# Patient Record
Sex: Male | Born: 1935 | Marital: Married | Smoking: Former smoker
Health system: Southern US, Community
[De-identification: ages and names within clinical notes are randomized; demographics above are authoritative.]

## PROBLEM LIST (undated history)

## (undated) DIAGNOSIS — I739 Peripheral vascular disease, unspecified: Secondary | ICD-10-CM

## (undated) DIAGNOSIS — I2510 Atherosclerotic heart disease of native coronary artery without angina pectoris: Secondary | ICD-10-CM

## (undated) DIAGNOSIS — M1990 Unspecified osteoarthritis, unspecified site: Secondary | ICD-10-CM

## (undated) DIAGNOSIS — G4730 Sleep apnea, unspecified: Secondary | ICD-10-CM

## (undated) DIAGNOSIS — M1A00X0 Idiopathic chronic gout, unspecified site, without tophus (tophi): Secondary | ICD-10-CM

## (undated) DIAGNOSIS — E785 Hyperlipidemia, unspecified: Secondary | ICD-10-CM

## (undated) DIAGNOSIS — I499 Cardiac arrhythmia, unspecified: Secondary | ICD-10-CM

## (undated) DIAGNOSIS — K219 Gastro-esophageal reflux disease without esophagitis: Secondary | ICD-10-CM

## (undated) DIAGNOSIS — I10 Essential (primary) hypertension: Secondary | ICD-10-CM

## (undated) DIAGNOSIS — N189 Chronic kidney disease, unspecified: Secondary | ICD-10-CM

## (undated) DIAGNOSIS — E119 Type 2 diabetes mellitus without complications: Secondary | ICD-10-CM

## (undated) DIAGNOSIS — J449 Chronic obstructive pulmonary disease, unspecified: Secondary | ICD-10-CM

## (undated) HISTORY — PX: CORONARY ARTERY BYPASS GRAFT: SHX141

## (undated) HISTORY — PX: PACEMAKER PLACEMENT: SHX43

## (undated) HISTORY — DX: Essential (primary) hypertension: I10

## (undated) HISTORY — DX: Chronic obstructive pulmonary disease, unspecified: J44.9

## (undated) HISTORY — DX: Unspecified osteoarthritis, unspecified site: M19.90

## (undated) HISTORY — PX: FOOT SURGERY: SHX648

## (undated) HISTORY — PX: OTHER SURGICAL HISTORY: SHX169

## (undated) HISTORY — PX: CHOLECYSTECTOMY: SHX55

## (undated) HISTORY — DX: Hyperlipidemia, unspecified: E78.5

## (undated) HISTORY — PX: APPENDECTOMY: SHX54

## (undated) HISTORY — PX: LEG SURGERY: SHX1003

## (undated) HISTORY — DX: Atherosclerotic heart disease of native coronary artery without angina pectoris: I25.10

## (undated) HISTORY — DX: Gastro-esophageal reflux disease without esophagitis: K21.9

## (undated) HISTORY — DX: Type 2 diabetes mellitus without complications: E11.9

## (undated) HISTORY — PX: SHOULDER SURGERY: SHX246

---

## 2009-10-02 DIAGNOSIS — E785 Hyperlipidemia, unspecified: Secondary | ICD-10-CM | POA: Insufficient documentation

## 2010-07-25 DIAGNOSIS — E119 Type 2 diabetes mellitus without complications: Secondary | ICD-10-CM | POA: Insufficient documentation

## 2011-02-25 DIAGNOSIS — Z951 Presence of aortocoronary bypass graft: Secondary | ICD-10-CM | POA: Insufficient documentation

## 2011-02-27 DIAGNOSIS — J40 Bronchitis, not specified as acute or chronic: Secondary | ICD-10-CM | POA: Insufficient documentation

## 2011-03-25 DIAGNOSIS — R05 Cough: Secondary | ICD-10-CM | POA: Insufficient documentation

## 2011-06-18 DIAGNOSIS — L439 Lichen planus, unspecified: Secondary | ICD-10-CM | POA: Insufficient documentation

## 2011-06-18 DIAGNOSIS — B079 Viral wart, unspecified: Secondary | ICD-10-CM | POA: Insufficient documentation

## 2011-08-19 DIAGNOSIS — R252 Cramp and spasm: Secondary | ICD-10-CM | POA: Insufficient documentation

## 2011-08-19 DIAGNOSIS — R600 Localized edema: Secondary | ICD-10-CM | POA: Insufficient documentation

## 2012-01-27 DIAGNOSIS — I200 Unstable angina: Secondary | ICD-10-CM | POA: Insufficient documentation

## 2012-01-30 DIAGNOSIS — Z9889 Other specified postprocedural states: Secondary | ICD-10-CM | POA: Insufficient documentation

## 2012-01-30 DIAGNOSIS — Z950 Presence of cardiac pacemaker: Secondary | ICD-10-CM | POA: Insufficient documentation

## 2012-02-19 DIAGNOSIS — I4892 Unspecified atrial flutter: Secondary | ICD-10-CM | POA: Insufficient documentation

## 2012-03-16 DIAGNOSIS — R002 Palpitations: Secondary | ICD-10-CM | POA: Insufficient documentation

## 2012-06-22 DIAGNOSIS — R609 Edema, unspecified: Secondary | ICD-10-CM | POA: Insufficient documentation

## 2013-03-07 DIAGNOSIS — N138 Other obstructive and reflux uropathy: Secondary | ICD-10-CM | POA: Insufficient documentation

## 2013-04-26 DIAGNOSIS — I480 Paroxysmal atrial fibrillation: Secondary | ICD-10-CM | POA: Insufficient documentation

## 2013-06-03 DIAGNOSIS — R42 Dizziness and giddiness: Secondary | ICD-10-CM | POA: Insufficient documentation

## 2013-06-09 DIAGNOSIS — L03211 Cellulitis of face: Secondary | ICD-10-CM | POA: Insufficient documentation

## 2013-06-09 DIAGNOSIS — H2513 Age-related nuclear cataract, bilateral: Secondary | ICD-10-CM | POA: Insufficient documentation

## 2013-06-09 DIAGNOSIS — H01006 Unspecified blepharitis left eye, unspecified eyelid: Secondary | ICD-10-CM

## 2013-06-09 DIAGNOSIS — H02839 Dermatochalasis of unspecified eye, unspecified eyelid: Secondary | ICD-10-CM | POA: Insufficient documentation

## 2013-06-09 DIAGNOSIS — H01003 Unspecified blepharitis right eye, unspecified eyelid: Secondary | ICD-10-CM | POA: Insufficient documentation

## 2013-06-17 DIAGNOSIS — H9190 Unspecified hearing loss, unspecified ear: Secondary | ICD-10-CM | POA: Insufficient documentation

## 2013-06-17 DIAGNOSIS — H6120 Impacted cerumen, unspecified ear: Secondary | ICD-10-CM | POA: Insufficient documentation

## 2013-09-22 DIAGNOSIS — N183 Chronic kidney disease, stage 3 unspecified: Secondary | ICD-10-CM | POA: Insufficient documentation

## 2014-04-10 DIAGNOSIS — M47816 Spondylosis without myelopathy or radiculopathy, lumbar region: Secondary | ICD-10-CM | POA: Insufficient documentation

## 2014-05-22 DIAGNOSIS — I872 Venous insufficiency (chronic) (peripheral): Secondary | ICD-10-CM | POA: Insufficient documentation

## 2015-01-26 DIAGNOSIS — Z961 Presence of intraocular lens: Secondary | ICD-10-CM | POA: Insufficient documentation

## 2015-02-02 DIAGNOSIS — Z961 Presence of intraocular lens: Secondary | ICD-10-CM | POA: Insufficient documentation

## 2015-04-02 DIAGNOSIS — L409 Psoriasis, unspecified: Secondary | ICD-10-CM | POA: Insufficient documentation

## 2016-02-25 DIAGNOSIS — E1142 Type 2 diabetes mellitus with diabetic polyneuropathy: Secondary | ICD-10-CM | POA: Insufficient documentation

## 2017-07-28 DIAGNOSIS — H353112 Nonexudative age-related macular degeneration, right eye, intermediate dry stage: Secondary | ICD-10-CM | POA: Insufficient documentation

## 2017-12-21 DIAGNOSIS — R5383 Other fatigue: Secondary | ICD-10-CM

## 2017-12-21 DIAGNOSIS — R5381 Other malaise: Secondary | ICD-10-CM | POA: Insufficient documentation

## 2018-01-22 DIAGNOSIS — M1A00X0 Idiopathic chronic gout, unspecified site, without tophus (tophi): Secondary | ICD-10-CM | POA: Insufficient documentation

## 2018-01-22 DIAGNOSIS — E1151 Type 2 diabetes mellitus with diabetic peripheral angiopathy without gangrene: Secondary | ICD-10-CM | POA: Insufficient documentation

## 2018-01-22 DIAGNOSIS — I25810 Atherosclerosis of coronary artery bypass graft(s) without angina pectoris: Secondary | ICD-10-CM | POA: Insufficient documentation

## 2018-01-22 DIAGNOSIS — I739 Peripheral vascular disease, unspecified: Secondary | ICD-10-CM | POA: Insufficient documentation

## 2018-02-19 ENCOUNTER — Other Ambulatory Visit (HOSPITAL_COMMUNITY): Payer: Self-pay | Admitting: Sports Medicine

## 2018-02-19 DIAGNOSIS — M4807 Spinal stenosis, lumbosacral region: Secondary | ICD-10-CM

## 2018-02-19 DIAGNOSIS — M5442 Lumbago with sciatica, left side: Secondary | ICD-10-CM

## 2018-02-19 DIAGNOSIS — M5137 Other intervertebral disc degeneration, lumbosacral region: Secondary | ICD-10-CM

## 2018-02-19 DIAGNOSIS — M25551 Pain in right hip: Secondary | ICD-10-CM

## 2018-02-19 DIAGNOSIS — M47816 Spondylosis without myelopathy or radiculopathy, lumbar region: Secondary | ICD-10-CM

## 2018-02-19 DIAGNOSIS — M25552 Pain in left hip: Secondary | ICD-10-CM

## 2018-02-19 DIAGNOSIS — M5441 Lumbago with sciatica, right side: Secondary | ICD-10-CM

## 2018-02-19 DIAGNOSIS — G8929 Other chronic pain: Secondary | ICD-10-CM

## 2018-03-03 ENCOUNTER — Ambulatory Visit (HOSPITAL_COMMUNITY)
Admission: RE | Admit: 2018-03-03 | Discharge: 2018-03-03 | Disposition: A | Discharge status: Home / Self Care | Payer: Medicare Other | Source: Ambulatory Visit | Attending: Sports Medicine | Admitting: Sports Medicine

## 2018-03-03 DIAGNOSIS — M25552 Pain in left hip: Secondary | ICD-10-CM | POA: Diagnosis not present

## 2018-03-03 DIAGNOSIS — M47817 Spondylosis without myelopathy or radiculopathy, lumbosacral region: Secondary | ICD-10-CM | POA: Insufficient documentation

## 2018-03-03 DIAGNOSIS — M5137 Other intervertebral disc degeneration, lumbosacral region: Secondary | ICD-10-CM

## 2018-03-03 DIAGNOSIS — M4316 Spondylolisthesis, lumbar region: Secondary | ICD-10-CM | POA: Insufficient documentation

## 2018-03-03 DIAGNOSIS — M5442 Lumbago with sciatica, left side: Secondary | ICD-10-CM | POA: Diagnosis not present

## 2018-03-03 DIAGNOSIS — M47816 Spondylosis without myelopathy or radiculopathy, lumbar region: Secondary | ICD-10-CM | POA: Diagnosis not present

## 2018-03-03 DIAGNOSIS — G8929 Other chronic pain: Secondary | ICD-10-CM

## 2018-03-03 DIAGNOSIS — M5441 Lumbago with sciatica, right side: Secondary | ICD-10-CM | POA: Insufficient documentation

## 2018-03-03 DIAGNOSIS — M48061 Spinal stenosis, lumbar region without neurogenic claudication: Secondary | ICD-10-CM | POA: Diagnosis not present

## 2018-03-03 DIAGNOSIS — M5136 Other intervertebral disc degeneration, lumbar region: Secondary | ICD-10-CM | POA: Insufficient documentation

## 2018-03-03 DIAGNOSIS — M25551 Pain in right hip: Secondary | ICD-10-CM | POA: Diagnosis not present

## 2018-03-03 DIAGNOSIS — M4807 Spinal stenosis, lumbosacral region: Secondary | ICD-10-CM

## 2018-03-04 ENCOUNTER — Ambulatory Visit (INDEPENDENT_AMBULATORY_CARE_PROVIDER_SITE_OTHER): Payer: Medicare Other | Admitting: Vascular Surgery

## 2018-03-04 ENCOUNTER — Encounter (INDEPENDENT_AMBULATORY_CARE_PROVIDER_SITE_OTHER): Payer: Self-pay | Admitting: Vascular Surgery

## 2018-03-04 DIAGNOSIS — Z87891 Personal history of nicotine dependence: Secondary | ICD-10-CM

## 2018-03-04 DIAGNOSIS — I70213 Atherosclerosis of native arteries of extremities with intermittent claudication, bilateral legs: Secondary | ICD-10-CM | POA: Diagnosis not present

## 2018-03-04 DIAGNOSIS — M79605 Pain in left leg: Secondary | ICD-10-CM

## 2018-03-04 DIAGNOSIS — E1142 Type 2 diabetes mellitus with diabetic polyneuropathy: Secondary | ICD-10-CM

## 2018-03-04 DIAGNOSIS — M79604 Pain in right leg: Secondary | ICD-10-CM | POA: Diagnosis not present

## 2018-03-04 DIAGNOSIS — I25118 Atherosclerotic heart disease of native coronary artery with other forms of angina pectoris: Secondary | ICD-10-CM

## 2018-03-04 DIAGNOSIS — Z794 Long term (current) use of insulin: Secondary | ICD-10-CM

## 2018-03-04 NOTE — Progress Notes (Signed)
MRN : [id number]  Jesse Rivera is a 84 y.o. ([birth year]) male who presents with chief complaint of No chief complaint on file. Marland Kitchen  History of Present Illness:   The patient is seen for evaluation of painful lower extremities. Patient notes the pain is variable and not always associated with activity.  The pain is somewhat consistent day to day occurring on most days. The patient notes the pain also occurs with standing and routinely seems worse as the day wears on. The pain has been progressive over the past several years. The patient states these symptoms are causing  a profound negative impact on quality of life and Rivera activities.  There is an ulcer of the left knee from a fall noninfected  The patient denies rest pain or dangling of an extremity off the side of the bed during the night for relief. No history of DVT or phlebitis. No prior interventions or surgeries.  There is a  history of back problems and DJD of the lumbar and sacral spine.    Current Meds  Medication Sig  . allopurinol (ZYLOPRIM) 100 MG tablet 1 tablet once Rivera  . atorvastatin (LIPITOR) 10 MG tablet TK 1 T PO QD  . BD PEN NEEDLE NANO U/F 32G X 4 MM MISC U TO INJECT INSULIN D UTD  . Cholecalciferol (VITAMIN D-1000 MAX ST) 1000 units tablet Take by mouth.  . clobetasol ointment (TEMOVATE) 0.05 % Apply to psoriasis areas on the body 1-2 times Rivera. Never apply to face.  . cyclobenzaprine (FLEXERIL) 5 MG tablet Take by mouth.  . diclofenac sodium (VOLTAREN) 1 % GEL APPLY A SMALL AMOUNT(2 GRAMS) TO THE AFFECTED AREA EVERY 12 HOURS AS NEEDED. 25 day supply  . fenofibrate (TRICOR) 48 MG tablet TK 1 T PO QD  . Fluocinolone Acetonide Scalp 0.01 % OIL Apply to the ears Rivera as needed for itching.  . fluticasone (FLONASE) 50 MCG/ACT nasal spray 2 sprays once Rivera  . furosemide (LASIX) 40 MG tablet 1 tablet once Rivera  . Guaifenesin 1200 MG TB12 Take by mouth.  . hydrocortisone 2.5 % cream Apply to face 1-2  times Rivera  . Insulin Glargine (LANTUS SOLOSTAR) 100 UNIT/ML Solostar Pen Inject into the skin.  Marland Kitchen isosorbide mononitrate (IMDUR) 30 MG 24 hr tablet TK 1 T PO D  . ketoconazole (NIZORAL) 2 % shampoo   . LANTUS SOLOSTAR 100 UNIT/ML Solostar Pen ADM 52 UNI [place] D  . magnesium oxide (MAG-OX) 400 MG tablet   . metoprolol succinate (TOPROL-XL) 25 MG 24 hr tablet TK 1 T PO D  . montelukast (SINGULAIR) 10 MG tablet   . nitroGLYCERIN (NITROSTAT) 0.4 MG SL tablet DISSOLVE 1 T UNDER TONGUE Q 5 MINUTES UP TO 3 DOSES  . omeprazole (PRILOSEC) 40 MG capsule 1 capsule 2 (two) times Rivera  . tamsulosin (FLOMAX) 0.4 MG CAPS capsule TK 2 CS PO D  . TIKOSYN 250 MCG capsule TK 1 C PO BID  . tiotropium (SPIRIVA) 18 MCG inhalation capsule Place into inhaler and inhale.  . warfarin (COUMADIN) 2.5 MG tablet Take by mouth.    Past Medical History:  Diagnosis Date  . Arthritis   . COPD (chronic obstructive pulmonary disease) (HCC)   . Coronary artery disease   . Diabetes mellitus without complication (HCC)   . GERD (gastroesophageal reflux disease)   . Hyperlipidemia   . Hypertension       Social History Social History   Tobacco Use  . Smoking status:  Former Smoker  . Smokeless tobacco: Never Used  Substance Use Topics  . Alcohol use: Never    Frequency: Never  . Drug use: Never    Family History Family History  Problem Relation Age of Onset  . Stroke Mother   . Heart attack Father   No family history of bleeding/clotting disorders, porphyria or autoimmune disease   Allergies  Allergen Reactions  . Cefaclor Anaphylaxis, Rash and Swelling  . Erythromycin Base Rash and Swelling  . Sulfa Antibiotics Itching, Rash and Swelling  . Aspirin   . Other Other (See Comments)    STERISTRIPS GLUE = [Rash], blisters, but tolerates paper tape NICOTINE PATCH GLUE = swelling Dental Bone Graft=itching, swelling, could not tolerate STERISTRIPS GLUE  = [Rash], blisters, but tolerates paper tape NICOTINE  PATCH GLUE = swelling Dental Bone Graft=itching, swelling, could not tolerate   . Codeine Itching and Rash     REVIEW OF SYSTEMS (Negative unless checked)  Constitutional: [] Weight loss  [] Fever  [] Chills Cardiac: [] Chest pain   [] Chest pressure   [] Palpitations   [] Shortness of breath when laying flat   [] Shortness of breath with exertion. Vascular:  [x] Pain in legs with walking   [x] Pain in legs at rest  [] History of DVT   [] Phlebitis   [] Swelling in legs   [] Varicose veins   [x] Non-healing ulcers Pulmonary:   [] Uses home oxygen   [] Productive cough   [] Hemoptysis   [] Wheeze  [] COPD   [] Asthma Neurologic:  [] Dizziness   [] Seizures   [] History of stroke   [] History of TIA  [] Aphasia   [] Vissual changes   [] Weakness or numbness in arm   [] Weakness or numbness in leg Musculoskeletal:   [] Joint swelling   [x] Joint pain   [x] Low back pain Hematologic:  [] Easy bruising  [] Easy bleeding   [] Hypercoagulable state   [] Anemic Gastrointestinal:  [] Diarrhea   [] Vomiting  [] Gastroesophageal reflux/heartburn   [] Difficulty swallowing. Genitourinary:  [] Chronic kidney disease   [] Difficult urination  [] Frequent urination   [] Blood in urine Skin:  [] Rashes   [] Ulcers  Psychological:  [] History of anxiety   []  History of major depression.  Physical Examination  There were no vitals filed for this visit. There is no height or weight on file to calculate BMI. Gen: WD/WN, NAD Head: [place]/AT, No temporalis wasting.  Ear/Nose/Throat: Hearing grossly intact, nares w/o erythema or drainage Eyes: PER, EOMI, sclera nonicteric.  Neck: Supple, no large masses.   Pulmonary:  Good air movement, no audible wheezing bilaterally, no use of accessory muscles.  Cardiac: RRR, no JVD Vascular: left knee with 1.5 cm circular wound with escar appears superficial  Vessel Right Left  Radial Palpable Palpable  PT Not Palpable Not Palpable  DP Not Palpable Not Palpable  Gastrointestinal: Non-distended. No guarding/no  peritoneal signs.  Musculoskeletal: M/S 5/5 throughout.  No deformity or atrophy.  Neurologic: CN 2-12 intact. Symmetrical.  Speech is fluent. Motor exam as listed above. Psychiatric: Judgment intact, Mood & affect appropriate for pt's clinical situation. Dermatologic: No rashes or ulcers noted.  No changes consistent with cellulitis. Lymph : No lichenification or skin changes of chronic lymphedema.  CBC No results found for: WBC, HGB, HCT, MCV, PLT  BMET No results found for: NA, K, CL, CO2, GLUCOSE, BUN, CREATININE, CALCIUM, GFRNONAA, GFRAA CrCl cannot be calculated (No successful lab value found.).  COAG No results found for: INR, PROTIME  Radiology Mr Lumbar Spine Wo Contrast  Result Date: 03/03/2018 CLINICAL DATA:  Chronic bilateral hip pain and back  pain. EXAM: MRI LUMBAR SPINE WITHOUT CONTRAST TECHNIQUE: Multiplanar, multisequence MR imaging of the lumbar spine was performed. No intravenous contrast was administered. COMPARISON:  None. FINDINGS: Segmentation:  5 lumbar type vertebral bodies assumed. Alignment: Mild curvature convex to the left with the apex at L3. 2 mm anterolisthesis L3-4. Vertebrae: No fracture or primary bone lesion. Discogenic changes at the L4-5 level. Conus medullaris and cauda equina: Conus extends to the L1 level. Conus and cauda equina appear normal. Paraspinal and other soft tissues: Negative Disc levels: T12-L1: Mild central bulging of the disc. No stenosis or neural compression. L1-2: Mild bulging of the disc. Mild facet hypertrophy. No stenosis. L2-3: Normal disc contour.  Mild facet osteoarthritis.  No stenosis. L3-4: Bilateral facet osteoarthritis with 2 mm of anterolisthesis. Mild bulging of the disc. No compressive central canal stenosis. Mild foraminal encroachment on the right by facet osteophytes. No definite compression of the exiting right L3 nerve. Some potential this nerve could be irritated. L4-5: Disc degeneration with increased disc T2 signal.  Bulging of the disc. Bilateral facet arthritis with mild edema. Mild narrowing of the lateral recesses but no definite neural compression. The facet arthritis could be a cause of back pain or referred facet syndrome pain. L5-S1: Minimal bulging of the disc. Bilateral facet osteoarthritis. Mild facet edema. No compressive stenosis. The facet disease could be a cause of back pain or referred facet syndrome pain. IMPRESSION: L3-4: Bilateral facet osteoarthritis with 2 mm of anterolisthesis. No central canal stenosis. Mild foraminal narrowing on the right because of encroachment by facet osteophytes. There could be pain secondary to facet osteoarthritis which could be back pain or referred facet syndrome pain. There is some potential the right L3 nerve could be irritated as it exits, but gross neural compression is not demonstrated. L4-5: Bilateral facet osteoarthritis with mild edema. Bulging of the disc. Mild narrowing of the lateral recesses but no distinct neural compression. The facet arthropathy could be a cause of back pain or referred facet syndrome pain. L5-S1: Bilateral facet osteoarthritis with mild edema, which could be associated with back pain or referred facet syndrome pain. Electronically Signed   By: Paulina Fusi M.D.   On: 03/03/2018 17:32     Assessment/Plan 1. Pain in both lower extremities  Recommend:  The patient has atypical pain symptoms for pure atherosclerotic disease. However, on physical exam there is evidence of degenerative disease and  arterial disease, given the diminished pulses.  Noninvasive studies including ABI's and arterial ultrasounds of the legs will be obtained and the patient will follow up with me to review these studies.  The patient should continue walking and begin a more formal exercise program. The patient should continue his antiplatelet therapy and aggressive treatment of the lipid abnormalities.  The patient should begin wearing graduated compression  socks 15-20 mmHg strength to control edema.    A total of 65 minutes was spent with this patient and greater than 50% was spent in counseling and coordination of care with the patient.  Discussion included the treatment options for vascular disease including indications for surgery and intervention.  Also discussed is the appropriate timing of treatment.  In addition medical therapy was discussed.  - VAS US AORTA/IVC/ILIACS; Future - VAS [place] ABI WITH/WO TBI; Future - VAS [place] LOWER EXTREMITY ARTERIAL DUPLEX; Future  2. Atherosclerosis of native artery of both lower extremities with intermittent claudication (HCC) Recommend:  The patient has atypical pain symptoms for pure atherosclerotic disease. However, on physical exam there is  evidence of degenerative disease and  arterial disease, given the diminished pulses.  Noninvasive studies including ABI's and arterial ultrasounds of the legs will be obtained and the patient will follow up with me to review these studies.  The patient should continue walking and begin a more formal exercise program. The patient should continue his antiplatelet therapy and aggressive treatment of the lipid abnormalities.  The patient should begin wearing graduated compression socks 15-20 mmHg strength to control edema.   - VAS US AORTA/IVC/ILIACS; Future - VAS [place] ABI WITH/WO TBI; Future - VAS [place] LOWER EXTREMITY ARTERIAL DUPLEX; Future  3. Coronary artery disease of native artery of native heart with stable angina pectoris (HCC) Continue cardiac and antihypertensive medications as already ordered and reviewed, no changes at this time.  Continue statin as ordered and reviewed, no changes at this time  Nitrates PRN for chest pain   4. Type 2 diabetes mellitus with diabetic polyneuropathy, with long-term current use of insulin (HCC) Continue hypoglycemic medications as already ordered, these medications have been reviewed and there are no changes at this  time.  Hgb A1C to be monitored as already arranged by primary service    Levora Dredge, MD  03/04/2018 1:23 PM

## 2018-03-04 NOTE — Progress Notes (Signed)
gerd 

## 2018-03-06 ENCOUNTER — Encounter (INDEPENDENT_AMBULATORY_CARE_PROVIDER_SITE_OTHER): Payer: Self-pay | Admitting: Vascular Surgery

## 2018-03-06 DIAGNOSIS — I2510 Atherosclerotic heart disease of native coronary artery without angina pectoris: Secondary | ICD-10-CM | POA: Insufficient documentation

## 2018-03-06 DIAGNOSIS — E119 Type 2 diabetes mellitus without complications: Secondary | ICD-10-CM | POA: Insufficient documentation

## 2018-03-06 DIAGNOSIS — I70219 Atherosclerosis of native arteries of extremities with intermittent claudication, unspecified extremity: Secondary | ICD-10-CM | POA: Insufficient documentation

## 2018-03-06 DIAGNOSIS — M79606 Pain in leg, unspecified: Secondary | ICD-10-CM | POA: Insufficient documentation

## 2018-03-31 ENCOUNTER — Encounter (INDEPENDENT_AMBULATORY_CARE_PROVIDER_SITE_OTHER): Payer: Medicare Other

## 2018-03-31 ENCOUNTER — Ambulatory Visit (INDEPENDENT_AMBULATORY_CARE_PROVIDER_SITE_OTHER): Payer: Medicare Other | Admitting: Nurse Practitioner

## 2018-04-29 ENCOUNTER — Encounter (INDEPENDENT_AMBULATORY_CARE_PROVIDER_SITE_OTHER): Payer: Self-pay | Admitting: Nurse Practitioner

## 2018-04-29 ENCOUNTER — Ambulatory Visit (INDEPENDENT_AMBULATORY_CARE_PROVIDER_SITE_OTHER): Payer: Medicare Other

## 2018-04-29 ENCOUNTER — Ambulatory Visit (INDEPENDENT_AMBULATORY_CARE_PROVIDER_SITE_OTHER): Payer: Medicare Other | Admitting: Nurse Practitioner

## 2018-04-29 ENCOUNTER — Other Ambulatory Visit: Payer: Self-pay

## 2018-04-29 VITALS — BP 117/65 | HR 70 | Resp 14 | Ht 67.0 in | Wt 189.0 lb

## 2018-04-29 DIAGNOSIS — Z794 Long term (current) use of insulin: Secondary | ICD-10-CM

## 2018-04-29 DIAGNOSIS — M79604 Pain in right leg: Secondary | ICD-10-CM | POA: Diagnosis not present

## 2018-04-29 DIAGNOSIS — M79605 Pain in left leg: Secondary | ICD-10-CM

## 2018-04-29 DIAGNOSIS — E1142 Type 2 diabetes mellitus with diabetic polyneuropathy: Secondary | ICD-10-CM

## 2018-04-29 DIAGNOSIS — I70213 Atherosclerosis of native arteries of extremities with intermittent claudication, bilateral legs: Secondary | ICD-10-CM

## 2018-04-29 DIAGNOSIS — I25118 Atherosclerotic heart disease of native coronary artery with other forms of angina pectoris: Secondary | ICD-10-CM | POA: Diagnosis not present

## 2018-05-06 NOTE — Progress Notes (Signed)
Subjective:    Patient ID: Jesse Rivera, male    DOB: February 25, 1934, 84 y.o.   MRN: [id number] Chief Complaint  Patient presents with  . Follow-up    ultrasound    HPI  Jesse Rivera is a 84 y.o. male presents for evaluation of painful lower extremities.  Pain is not always associated with activity per the patient.  He describes much more pain in his hip.  However he also describes pain with ambulation and cramping and some weakness.  He also has an ulceration of the left lower extremity from a fall.  He denies any rest pain like symptoms.  He denies any fever, chills, nausea, vomiting or diarrhea.  Patient has a previous history of a left lower extremity bypass.  Records do not show what type of bypass it is however ultrasound suggest it is a femoropopliteal bypass.  The patient underwent bilateral ABIs today which were noncompressible bilaterally.  He has a TBI of 0.51 on the right and a TBI of 0.46 on the left.  He underwent a duplex of his bilateral lower extremities which revealed monophasic waveforms on his right lower extremity starting at the level of the proximal popliteal artery.  His left lower extremity shows a patent left graft.  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  Past Medical History:  Diagnosis Date  . Arthritis   . COPD (chronic obstructive pulmonary disease) (HCC)   . Coronary artery disease   . Diabetes mellitus without complication (HCC)   . GERD (gastroesophageal reflux disease)   . Hyperlipidemia   . Hypertension     Past Surgical History:  Procedure Laterality Date  . APPENDECTOMY    . CHOLECYSTECTOMY    . FOOT SURGERY Bilateral   . heart surgery'     triple bypass  . LEG SURGERY Left    bypass surgery  . PACEMAKER PLACEMENT    . SHOULDER SURGERY Left     Social History   Socioeconomic History  . Marital status: Married    Spouse name: Not on file  .  Number of children: Not on file  . Years of education: Not on file  . Highest education level: Not on file  Occupational History  . Not on file  Social Needs  . Financial resource strain: Not on file  . Food insecurity:    Worry: Not on file    Inability: Not on file  . Transportation needs:    Medical: Not on file    Non-medical: Not on file  Tobacco Use  . Smoking status: Former Games developer  . Smokeless tobacco: Never Used  Substance and Sexual Activity  . Alcohol use: Never    Frequency: Never  . Drug use: Never  . Sexual activity: Not on file  Lifestyle  . Physical activity:    Days per week: Not on file    Minutes per session: Not on file  . Stress: Not on file  Relationships  . Social connections:    Talks on phone: Not on file    Gets together: Not on file    Attends religious service: Not on file    Active member of club or organization: Not on file    Attends meetings of clubs or organizations: Not on file    Relationship status: Not on file  . Intimate partner violence:    Fear of current or ex partner: Not on file  Emotionally abused: Not on file    Physically abused: Not on file    Forced sexual activity: Not on file  Other Topics Concern  . Not on file  Social History Narrative  . Not on file    Family History  Problem Relation Age of Onset  . Stroke Mother   . Heart attack Father     Allergies  Allergen Reactions  . Cefaclor Anaphylaxis, Rash and Swelling  . Erythromycin Base Rash and Swelling  . Sulfa Antibiotics Itching, Rash and Swelling  . Aspirin   . Other Other (See Comments)    STERISTRIPS GLUE = [Rash], blisters, but tolerates paper tape NICOTINE PATCH GLUE = swelling Dental Bone Graft=itching, swelling, could not tolerate STERISTRIPS GLUE  = [Rash], blisters, but tolerates paper tape NICOTINE PATCH GLUE = swelling Dental Bone Graft=itching, swelling, could not tolerate   . Codeine Itching and Rash     Review of Systems    Review of Systems: Negative Unless Checked Constitutional: [] Weight loss  [] Fever  [] Chills Cardiac: [] Chest pain   []  Atrial Fibrillation  [] Palpitations   [] Shortness of breath when laying flat   [] Shortness of breath with exertion. [] Shortness of breath at rest Vascular:  [x] Pain in legs with walking   [x] Pain in legs with standing [] Pain in legs when laying flat   [x] Claudication    [] Pain in feet when laying flat    [] History of DVT   [] Phlebitis   [] Swelling in legs   [] Varicose veins   [] Non-healing ulcers Pulmonary:   [] Uses home oxygen   [] Productive cough   [] Hemoptysis   [] Wheeze  [] COPD   [] Asthma Neurologic:  [] Dizziness   [] Seizures  [] Blackouts [] History of stroke   [] History of TIA  [] Aphasia   [] Temporary Blindness   [] Weakness or numbness in arm   [x] Weakness or numbness in leg Musculoskeletal:   [] Joint swelling   [x] Joint pain   [x] Low back pain  []  History of Knee Replacement [] Arthritis [] back Surgeries  []  Spinal Stenosis    Hematologic:  [] Easy bruising  [] Easy bleeding   [] Hypercoagulable state   [] Anemic Gastrointestinal:  [] Diarrhea   [] Vomiting  [] Gastroesophageal reflux/heartburn   [] Difficulty swallowing. [] Abdominal pain Genitourinary:  [] Chronic kidney disease   [] Difficult urination  [] Anuric   [] Blood in urine [] Frequent urination  [] Burning with urination   [] Hematuria Skin:  [] Rashes   [] Ulcers [x] Wounds Psychological:  [x] History of anxiety   []  History of major depression  []  Memory Difficulties     Objective:   Physical Exam  BP 117/65   Pulse 70   Resp 14   Ht 5\' 7"  (1.702 m)   Wt 189 lb (85.7 kg)   BMI 29.60 kg/m   Gen: WD/WN, NAD Head: [place]/AT, No temporalis wasting.  Ear/Nose/Throat: Hearing grossly intact, nares w/o erythema or drainage Eyes: PER, EOMI, sclera nonicteric.  Neck: Supple, no masses.  No JVD.  Pulmonary:  Good air movement, no use of accessory muscles.  Cardiac: RRR Vascular:  Shallow ulcer from fall on left lower  extremity. Vessel Right Left  Radial Palpable Palpable  Dorsalis Pedis  not palpable  not palpable  Posterior Tibial  not palpable  point palpable   Gastrointestinal: soft, non-distended. No guarding/no peritoneal signs.  Musculoskeletal: M/S 5/5 throughout.  No deformity or atrophy.  Neurologic: Pain and light touch intact in extremities.  Symmetrical.  Speech is fluent. Motor exam as listed above. Psychiatric: Judgment intact, Mood & affect appropriate for pt's clinical situation. Dermatologic: No Venous  rashes.No changes consistent with cellulitis. Lymph : No Cervical lymphadenopathy, no lichenification or skin changes of chronic lymphedema.      Assessment & Plan:   1. Atherosclerosis of native artery of both lower extremities with intermittent claudication (HCC) The patient underwent bilateral ABIs today which were noncompressible bilaterally.  He has a TBI of 0.51 on the right and a TBI of 0.46 on the left.  He underwent a duplex of his bilateral lower extremities which revealed monophasic waveforms on his right lower extremity starting at the level of the proximal popliteal artery.  His left lower extremity shows a patent left graft.  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  Recommend:  The patient has experienced increased symptoms and is now describing lifestyle limiting claudication and mild rest pain.  Given the severity of the patient's left lower extremity symptoms the patient should undergo angiography and intervention.  Based on the results of the left lower extremity angiogram it may also be prudent to follow with a right lower extremity angiogram.  Risk and benefits were reviewed the patient.  Indications for the procedure were reviewed.  The patient wishes to consult with his primary care physician before he makes any decisions regarding any intervention on his lower extremities.  I  informed the patient that he should give us a call if he decides that he wishes to proceed with a lower extremity angiogram.  The patient should continue walking and begin a more formal exercise program.  The patient should continue antiplatelet therapy and aggressive treatment of the lipid abnormalities   2. Type 2 diabetes mellitus with diabetic polyneuropathy, with long-term current use of insulin (HCC) Continue hypoglycemic medications as already ordered, these medications have been reviewed and there are no changes at this time.  Hgb A1C to be monitored as already arranged by primary service   3. Coronary artery disease of native artery of native heart with stable angina pectoris (HCC) Continue cardiac and antihypertensive medications as already ordered and reviewed, no changes at this time.  Continue statin as ordered and reviewed, no changes at this time  Nitrates PRN for chest pain    Current Outpatient Medications on File Prior to Visit  Medication Sig Dispense Refill  . allopurinol (ZYLOPRIM) 100 MG tablet 1 tablet once daily    . atorvastatin (LIPITOR) 10 MG tablet TK 1 T PO QD  0  . BD PEN NEEDLE NANO U/F 32G X 4 MM MISC U TO INJECT INSULIN D UTD  3  . Cholecalciferol (VITAMIN D-1000 MAX ST) 1000 units tablet Take by mouth.    . clobetasol ointment (TEMOVATE) 0.05 % Apply to psoriasis areas on the body 1-2 times daily. Never apply to face.    . cyclobenzaprine (FLEXERIL) 5 MG tablet Take by mouth.    . diclofenac sodium (VOLTAREN) 1 % GEL APPLY A SMALL AMOUNT(2 GRAMS) TO THE AFFECTED AREA EVERY 12 HOURS AS NEEDED. 25 day supply    . fenofibrate (TRICOR) 48 MG tablet TK 1 T PO QD  3  . Fluocinolone Acetonide Scalp 0.01 % OIL Apply to the ears daily as needed for itching.    . fluticasone (FLONASE) 50 MCG/ACT nasal spray 2 sprays once daily    . furosemide (LASIX) 40 MG tablet 1 tablet once daily    . Guaifenesin 1200 MG TB12 Take by mouth.    . hydrocortisone 2.5 % cream  Apply to face  1-2 times daily    . Insulin Glargine (LANTUS SOLOSTAR) 100 UNIT/ML Solostar Pen Inject into the skin.    Marland Kitchen isosorbide mononitrate (IMDUR) 30 MG 24 hr tablet 60 mg.   3  . ketoconazole (NIZORAL) 2 % shampoo     . LANTUS SOLOSTAR 100 UNIT/ML Solostar Pen ADM 52 UNI [place] D  3  . magnesium oxide (MAG-OX) 400 MG tablet   1  . metoprolol succinate (TOPROL-XL) 25 MG 24 hr tablet TK 1 T PO D  11  . montelukast (SINGULAIR) 10 MG tablet   3  . nitroGLYCERIN (NITROSTAT) 0.4 MG SL tablet DISSOLVE 1 T UNDER TONGUE Q 5 MINUTES UP TO 3 DOSES    . omeprazole (PRILOSEC) 40 MG capsule 1 capsule 2 (two) times daily    . tamsulosin (FLOMAX) 0.4 MG CAPS capsule TK 2 CS PO D  3  . TIKOSYN 250 MCG capsule TK 1 C PO BID  5  . tiotropium (SPIRIVA) 18 MCG inhalation capsule Place into inhaler and inhale.    . warfarin (COUMADIN) 2.5 MG tablet Take by mouth.     No current facility-administered medications on file prior to visit.     There are no Patient Instructions on file for this visit. No follow-ups on file.   Georgiana Spinner, NP  This note was completed with Office manager.  Any errors are purely unintentional.

## 2018-05-19 DIAGNOSIS — J9611 Chronic respiratory failure with hypoxia: Secondary | ICD-10-CM | POA: Insufficient documentation

## 2018-05-19 DIAGNOSIS — Z0000 Encounter for general adult medical examination without abnormal findings: Secondary | ICD-10-CM | POA: Insufficient documentation

## 2018-05-20 ENCOUNTER — Encounter: Payer: Self-pay | Admitting: Podiatry

## 2018-05-20 ENCOUNTER — Ambulatory Visit (INDEPENDENT_AMBULATORY_CARE_PROVIDER_SITE_OTHER): Payer: Medicare Other

## 2018-05-20 ENCOUNTER — Ambulatory Visit (INDEPENDENT_AMBULATORY_CARE_PROVIDER_SITE_OTHER): Payer: Medicare Other | Admitting: Podiatry

## 2018-05-20 ENCOUNTER — Other Ambulatory Visit: Payer: Self-pay

## 2018-05-20 VITALS — BP 130/80 | HR 85 | Resp 16

## 2018-05-20 DIAGNOSIS — M7752 Other enthesopathy of left foot: Secondary | ICD-10-CM

## 2018-05-20 DIAGNOSIS — H903 Sensorineural hearing loss, bilateral: Secondary | ICD-10-CM | POA: Insufficient documentation

## 2018-05-20 DIAGNOSIS — Q828 Other specified congenital malformations of skin: Secondary | ICD-10-CM | POA: Diagnosis not present

## 2018-05-20 DIAGNOSIS — E1142 Type 2 diabetes mellitus with diabetic polyneuropathy: Secondary | ICD-10-CM

## 2018-05-20 NOTE — Progress Notes (Signed)
   Subjective:    Patient ID: Jesse Rivera, male    DOB: February 09, 1934, 85 y.o.   MRN: [id number]  HPI    Review of Systems  All other systems reviewed and are negative.      Objective:   Physical Exam        Assessment & Plan:

## 2018-05-20 NOTE — Progress Notes (Addendum)
Subjective:  Patient ID: Jesse Rivera, male    DOB: 03-Mar-1934,  MRN: [id number] HPI Chief Complaint  Patient presents with  . Foot Pain    L 2nd toe (distal/tip) x 3 wks; 4/10 sharp burning intermittent pain -no injury Tx: none -pressure/shoes makes it worse -FBS: 100 x couple days A1C: na    85 y.o. male presents with the above complaint.   ROS: Denies fever chills nausea vomiting muscle aches pains calf pain back pain chest pain shortness of breath.  Past Medical History:  Diagnosis Date  . Arthritis   . COPD (chronic obstructive pulmonary disease) (HCC)   . Coronary artery disease   . Diabetes mellitus without complication (HCC)   . GERD (gastroesophageal reflux disease)   . Hyperlipidemia   . Hypertension    Past Surgical History:  Procedure Laterality Date  . APPENDECTOMY    . CHOLECYSTECTOMY    . FOOT SURGERY Bilateral   . heart surgery'     triple bypass  . LEG SURGERY Left    bypass surgery  . PACEMAKER PLACEMENT    . SHOULDER SURGERY Left     Current Outpatient Medications:  .  acetylcysteine (MUCOMYST) 20 % nebulizer solution, Inhale into the lungs., Disp: , Rfl:  .  allopurinol (ZYLOPRIM) 100 MG tablet, 1 tablet once daily, Disp: , Rfl:  .  atorvastatin (LIPITOR) 10 MG tablet, TK 1 T PO QD, Disp: , Rfl: 0 .  BD PEN NEEDLE NANO U/F 32G X 4 MM MISC, U TO INJECT INSULIN D UTD, Disp: , Rfl: 3 .  Cholecalciferol (VITAMIN D-1000 [address]) 1000 units tablet, Take by mouth., Disp: , Rfl:  .  clobetasol ointment (TEMOVATE) 0.05 %, Apply to psoriasis areas on the body 1-2 times daily. Never apply to face., Disp: , Rfl:  .  cyclobenzaprine (FLEXERIL) 5 MG tablet, Take by mouth., Disp: , Rfl:  .  diclofenac sodium (VOLTAREN) 1 % GEL, APPLY A SMALL AMOUNT(2 GRAMS) TO THE AFFECTED AREA EVERY 12 HOURS AS NEEDED. 25 day supply, Disp: , Rfl:  .  fenofibrate (TRICOR) 48 MG tablet, TK 1 T PO QD, Disp: , Rfl: 3 .  Fluocinolone Acetonide Scalp 0.01 % OIL, Apply to the ears daily  as needed for itching., Disp: , Rfl:  .  fluticasone (FLONASE) 50 MCG/ACT nasal spray, 2 sprays once daily, Disp: , Rfl:  .  furosemide (LASIX) 40 MG tablet, 1 tablet once daily, Disp: , Rfl:  .  Guaifenesin 1200 MG TB12, Take by mouth., Disp: , Rfl:  .  hydrocortisone 2.5 % cream, Apply to face 1-2 times daily, Disp: , Rfl:  .  Insulin Glargine (LANTUS SOLOSTAR) 100 UNIT/ML Solostar Pen, Inject into the skin., Disp: , Rfl:  .  isosorbide mononitrate (IMDUR) 30 MG 24 hr tablet, 60 mg. , Disp: , Rfl: 3 .  ketoconazole (NIZORAL) 2 % shampoo, , Disp: , Rfl:  .  LANTUS SOLOSTAR 100 UNIT/ML Solostar Pen, ADM 52 UNI [place] D, Disp: , Rfl: 3 .  magnesium oxide (MAG-OX) 400 MG tablet, , Disp: , Rfl: 1 .  metoprolol succinate (TOPROL-XL) 25 MG 24 hr tablet, TK 1 T PO D, Disp: , Rfl: 11 .  montelukast (SINGULAIR) 10 MG tablet, , Disp: , Rfl: 3 .  Nebulizers (VIOS LC SPRINT DELUXE) MISC, USE TO ADMINISTER NEBULIZER SOLUTIONS, Disp: , Rfl:  .  nitroGLYCERIN (NITROSTAT) 0.4 MG SL tablet, DISSOLVE 1 T UNDER TONGUE Q 5 MINUTES UP TO 3 DOSES, Disp: , Rfl:  .  omeprazole (PRILOSEC) 40 MG capsule, 1 capsule 2 (two) times daily, Disp: , Rfl:  .  tamsulosin (FLOMAX) 0.4 MG CAPS capsule, TK 2 CS PO D, Disp: , Rfl: 3 .  TIKOSYN 250 MCG capsule, TK 1 C PO BID, Disp: , Rfl: 5 .  tiotropium (SPIRIVA) 18 MCG inhalation capsule, Place into inhaler and inhale., Disp: , Rfl:  .  warfarin (COUMADIN) 2.5 MG tablet, Take by mouth., Disp: , Rfl:   Allergies  Allergen Reactions  . Cefaclor Anaphylaxis, Rash and Swelling  . Erythromycin Base Rash and Swelling  . Sulfa Antibiotics Itching, Rash and Swelling  . Aspirin   . Other Other (See Comments)    STERISTRIPS GLUE = [Rash], blisters, but tolerates paper tape NICOTINE PATCH GLUE = swelling Dental Bone Graft=itching, swelling, could not tolerate STERISTRIPS GLUE  = [Rash], blisters, but tolerates paper tape NICOTINE PATCH GLUE = swelling Dental Bone Graft=itching,  swelling, could not tolerate   . Codeine Itching and Rash   Review of Systems Objective:   Vitals:   05/20/18 1402  BP: 130/80  Pulse: 85  Resp: 16    General: Well developed, nourished, in no acute distress, alert and oriented x3   Dermatological: Skin is warm, dry and supple bilateral. Nails x 10 are well maintained; remaining integument appears unremarkable at this time. There are no open sores, no preulcerative lesions, no rash or signs of infection present.  Vascular: Dorsalis Pedis artery and Posterior Tibial artery pedal pulses are 2/4 bilateral with immedate capillary fill time. Pedal hair growth present. No varicosities and no lower extremity edema present bilateral.   Neruologic: Grossly intact via light touch bilateral. Vibratory intact via tuning fork bilateral. Protective threshold with Semmes Wienstein monofilament intact to all pedal sites bilateral. Patellar and Achilles deep tendon reflexes 2+ bilateral. No Babinski or clonus noted bilateral.   Musculoskeletal: No gross boney pedal deformities bilateral. No pain, crepitus, or limitation noted with foot and ankle range of motion bilateral. Muscular strength 5/5 in all groups tested bilateral.  Hammertoe deformities extended position at the PIPJ's 2 through 3 bilaterally pain on palpation of the second and third metatarsophalangeal joints bilaterally.  No open lesions or wounds.  Gait: Unassisted, Nonantalgic.    Radiographs: Radiographs taken today demonstrate hammertoe deformities and osteoarthritic changes no signs of osteomyelitis.   Assessment & Plan:   Assessment: Distal clavus no erythema edema cellulitis drainage or odor  Plan: Debrided reactive hyperkeratotic tissue placed Band-Aid and padding.  He will follow-up with routine nail care.  Watch for signs symptoms of infection if there are any notify us immediately.     Jesse Rivera T. [place], [place]

## 2018-07-30 DIAGNOSIS — I2781 Cor pulmonale (chronic): Secondary | ICD-10-CM | POA: Insufficient documentation

## 2018-08-13 ENCOUNTER — Ambulatory Visit
Admission: RE | Admit: 2018-08-13 | Discharge: 2018-08-13 | Disposition: A | Discharge status: Home / Self Care | Payer: Medicare Other | Source: Ambulatory Visit | Attending: Physician Assistant | Admitting: Physician Assistant

## 2018-08-13 ENCOUNTER — Other Ambulatory Visit (HOSPITAL_COMMUNITY): Payer: Self-pay | Admitting: Physician Assistant

## 2018-08-13 ENCOUNTER — Other Ambulatory Visit: Payer: Self-pay

## 2018-08-13 ENCOUNTER — Other Ambulatory Visit: Payer: Self-pay | Admitting: Physician Assistant

## 2018-08-13 DIAGNOSIS — R531 Weakness: Secondary | ICD-10-CM | POA: Diagnosis present

## 2018-08-16 DIAGNOSIS — I679 Cerebrovascular disease, unspecified: Secondary | ICD-10-CM | POA: Insufficient documentation

## 2018-08-19 ENCOUNTER — Ambulatory Visit: Payer: Medicare Other | Admitting: Podiatry

## 2018-08-30 DIAGNOSIS — Z8673 Personal history of transient ischemic attack (TIA), and cerebral infarction without residual deficits: Secondary | ICD-10-CM | POA: Insufficient documentation

## 2018-08-30 DIAGNOSIS — I5033 Acute on chronic diastolic (congestive) heart failure: Secondary | ICD-10-CM | POA: Insufficient documentation

## 2018-09-14 ENCOUNTER — Encounter: Payer: Self-pay | Admitting: *Deleted

## 2018-09-14 DIAGNOSIS — R1314 Dysphagia, pharyngoesophageal phase: Secondary | ICD-10-CM | POA: Insufficient documentation

## 2018-09-14 DIAGNOSIS — Z7901 Long term (current) use of anticoagulants: Secondary | ICD-10-CM | POA: Insufficient documentation

## 2018-09-15 ENCOUNTER — Ambulatory Visit: Admission: RE | Admit: 2018-09-15 | Payer: Medicare Other | Source: Home / Self Care | Admitting: Internal Medicine

## 2018-09-15 ENCOUNTER — Encounter: Admission: RE | Payer: Self-pay | Source: Home / Self Care

## 2018-09-15 HISTORY — DX: Cardiac arrhythmia, unspecified: I49.9

## 2018-09-15 HISTORY — DX: Chronic kidney disease, unspecified: N18.9

## 2018-09-15 HISTORY — DX: Sleep apnea, unspecified: G47.30

## 2018-09-15 HISTORY — DX: Idiopathic chronic gout, unspecified site, without tophus (tophi): M1A.00X0

## 2018-09-15 HISTORY — DX: Peripheral vascular disease, unspecified: I73.9

## 2018-09-15 SURGERY — ESOPHAGOGASTRODUODENOSCOPY (EGD) WITH PROPOFOL
Anesthesia: General

## 2018-11-09 DIAGNOSIS — H6123 Impacted cerumen, bilateral: Secondary | ICD-10-CM | POA: Insufficient documentation

## 2018-12-11 IMAGING — MR MR LUMBAR SPINE W/O CM
5 of 6 series · 29 of 48 positions shown · non-contrast
Comparison: None.

CLINICAL DATA: Chronic bilateral hip pain and back pain.

EXAM:
MRI LUMBAR SPINE WITHOUT CONTRAST
TECHNIQUE: Multiplanar, multisequence MR imaging of the lumbar spine was
performed. No intravenous contrast was administered.

[Series 5: T2 · sagittal · 4.0mm · 0.73mm/px · 6 of 17 slices shown (1 of 3)]
[im 1/17]
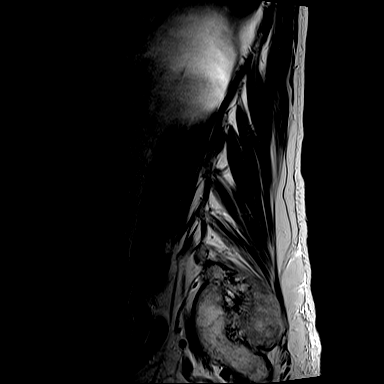
[im 4/17]
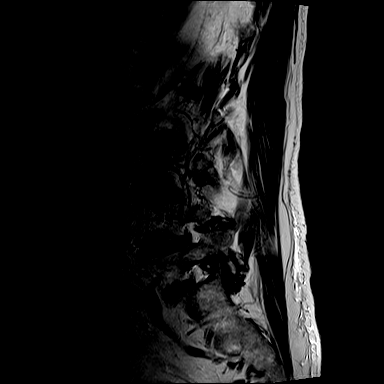
[im 7/17]
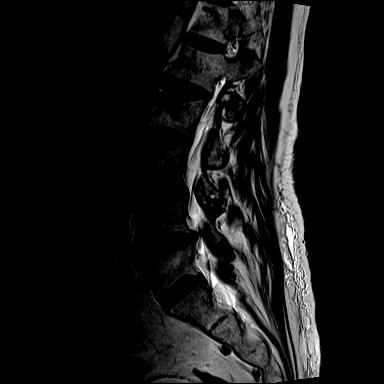
[im 10/17]
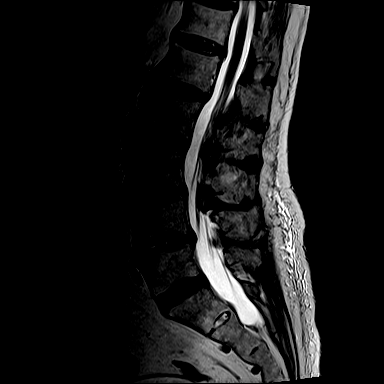
[im 13/17]
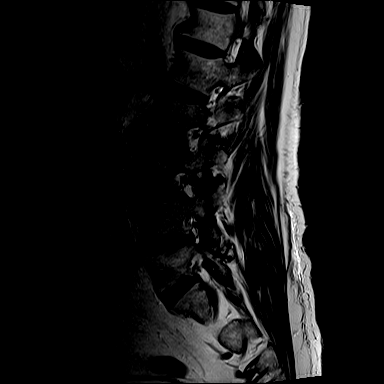
[im 17/17]
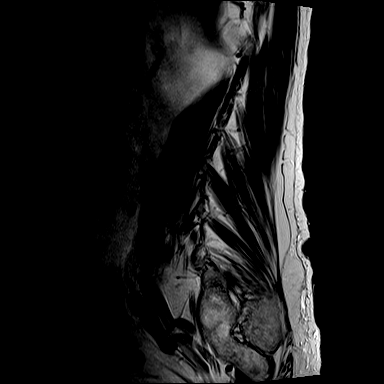

[Series 7: T1 · sagittal · 4.0mm · 0.91mm/px · 6 of 17 slices shown (1 of 2)]
[im 1/17]
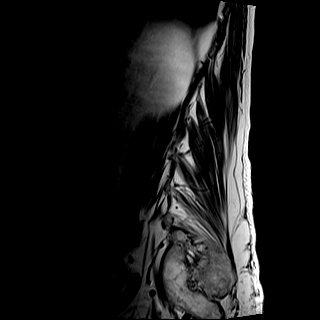
[im 4/17]
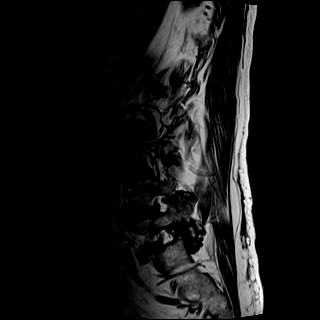
[im 7/17]
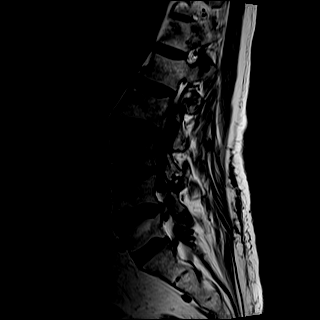
[im 10/17]
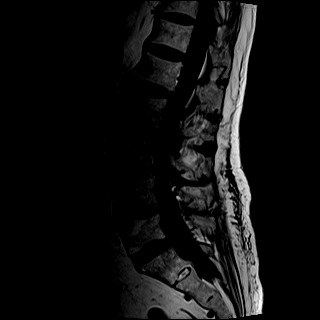
[im 13/17]
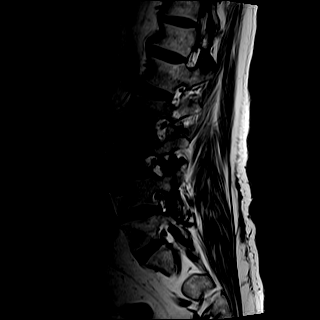
[im 17/17]
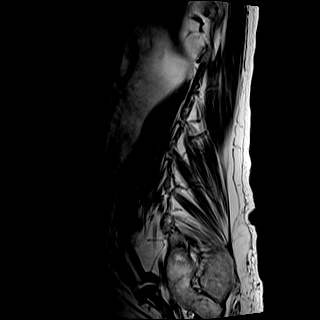

[Series 8: T2 · sagittal · 4.0mm · 0.76mm/px · 6 of 17 slices shown (2 of 3)]
[im 1/17]
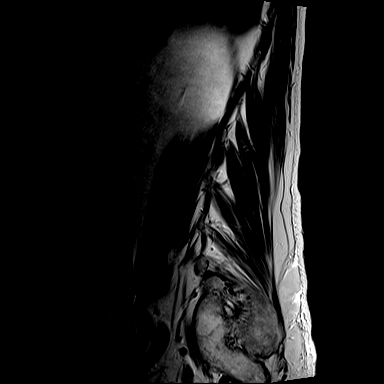
[im 4/17]
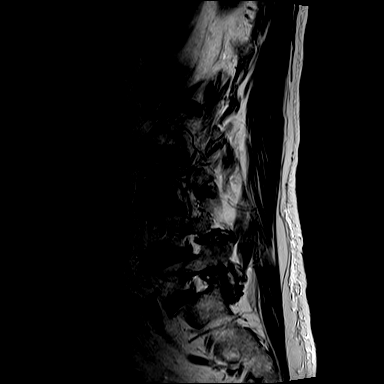
[im 7/17]
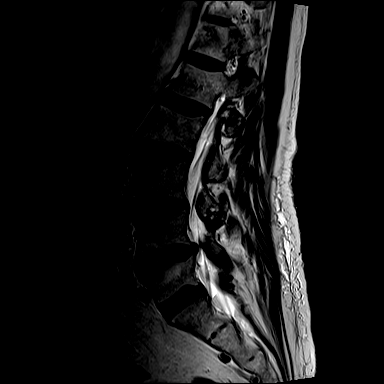
[im 10/17]
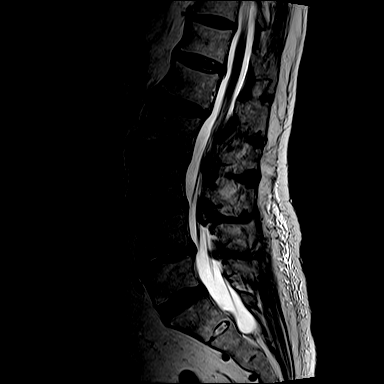
[im 13/17]
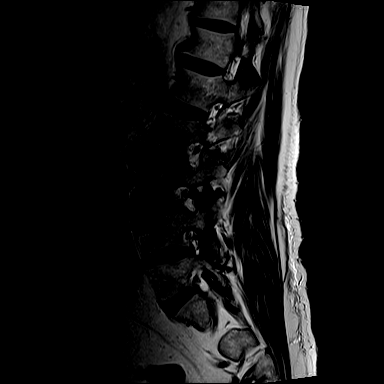
[im 17/17]
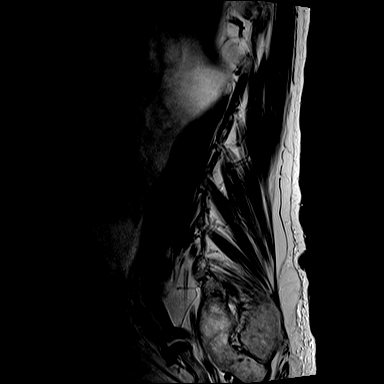

[Series 9: T2 · axial · 4.0mm · 0.57mm/px · z∈[-89,+128]mm · 9 of 34 slices shown (3 of 3)]
[im 1/34]
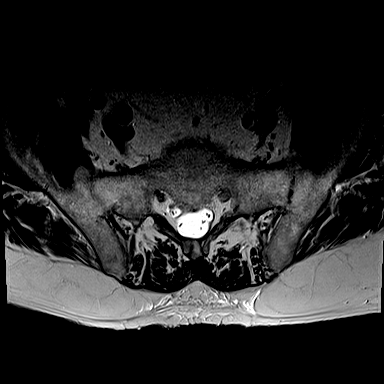
[im 7/34]
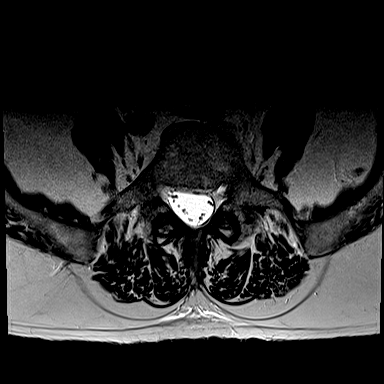
[im 10/34]
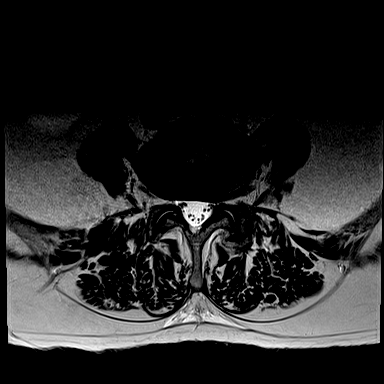
[im 16/34]
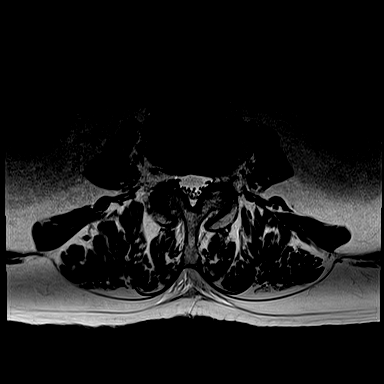
[im 19/34]
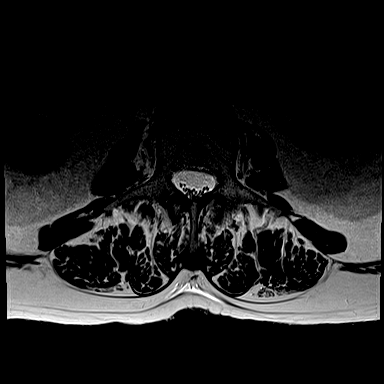
[im 25/34]
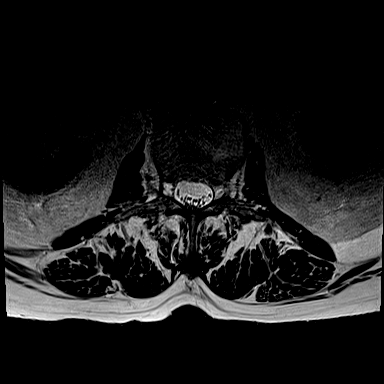
[im 28/34]
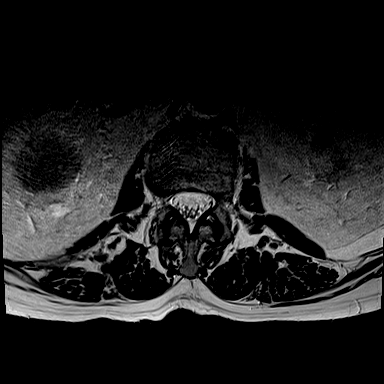
[im 31/34]
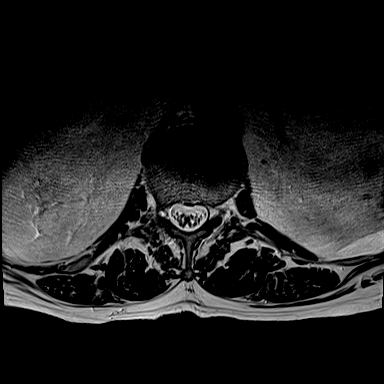
[im 34/34]
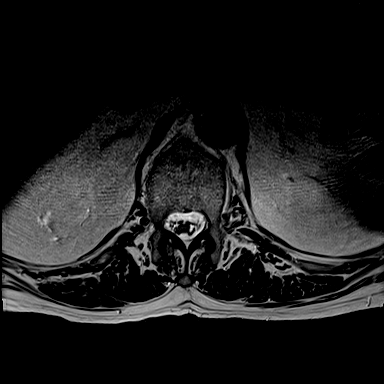

[Series 10: T1 · axial · 4.0mm · 0.34mm/px · z∈[-89,-55]mm · 2 of 34 slices shown (2 of 2)]
[im 1/34]
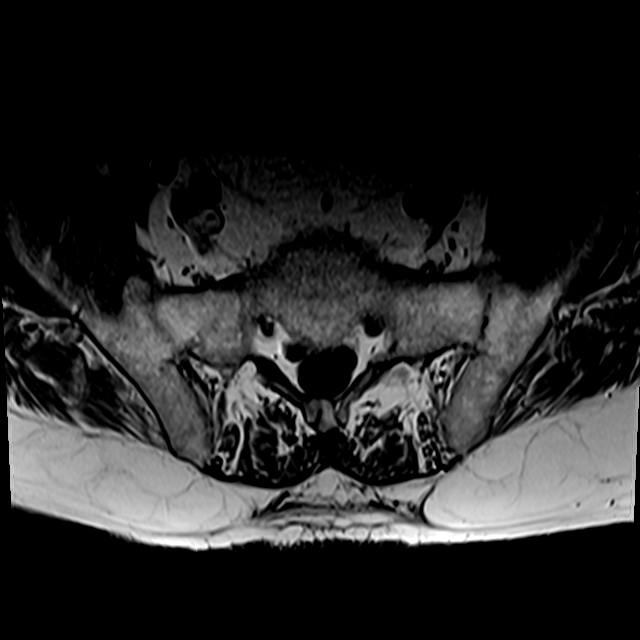
[im 7/34]
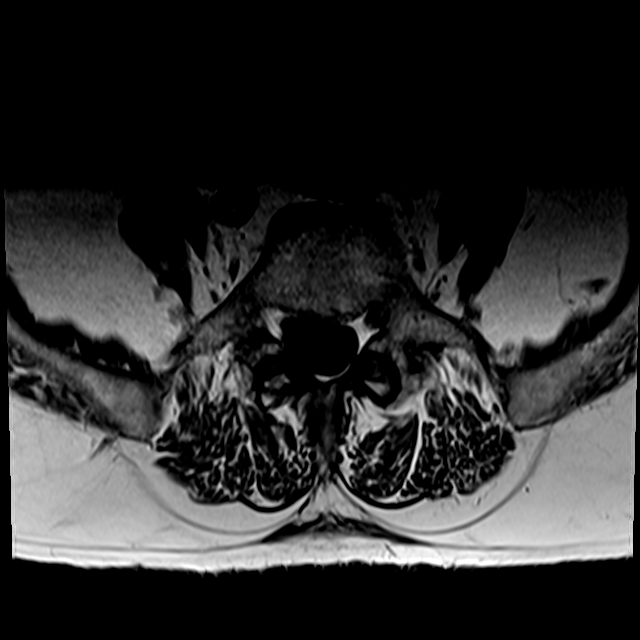

[29 of 48 positions shown; findings below may reference images not displayed]

FINDINGS: Segmentation:  5 lumbar type vertebral bodies assumed.

Alignment: Mild curvature convex to the left with the apex at L3. 2
mm anterolisthesis L3-4.

Vertebrae: No fracture or primary bone lesion. Discogenic changes at
the L4-5 level.

Conus medullaris and cauda equina: Conus extends to the L1 level.
Conus and cauda equina appear normal.

Paraspinal and other soft tissues: Negative

Disc levels:

T12-L1: Mild central bulging of the disc. No stenosis or neural
compression.

L1-2: Mild bulging of the disc. Mild facet hypertrophy. No stenosis.

L2-3: Normal disc contour.  Mild facet osteoarthritis.  No stenosis.

L3-4: Bilateral facet osteoarthritis with 2 mm of anterolisthesis.
Mild bulging of the disc. No compressive central canal stenosis.
Mild foraminal encroachment on the right by facet osteophytes. No
definite compression of the exiting right L3 nerve. Some potential
this nerve could be irritated.

L4-5: Disc degeneration with increased disc T2 signal. Bulging of
the disc. Bilateral facet arthritis with mild edema. Mild narrowing
of the lateral recesses but no definite neural compression. The
facet arthritis could be a cause of back pain or referred facet
syndrome pain.

L5-S1: Minimal bulging of the disc. Bilateral facet osteoarthritis.
Mild facet edema. No compressive stenosis. The facet disease could
be a cause of back pain or referred facet syndrome pain.
IMPRESSION: L3-4: Bilateral facet osteoarthritis with 2 mm of anterolisthesis.
No central canal stenosis. Mild foraminal narrowing on the right
because of encroachment by facet osteophytes. There could be pain
secondary to facet osteoarthritis which could be back pain or
referred facet syndrome pain. There is some potential the right L3
nerve could be irritated as it exits, but gross neural compression
is not demonstrated.

L4-5: Bilateral facet osteoarthritis with mild edema. Bulging of the
disc. Mild narrowing of the lateral recesses but no distinct neural
compression. The facet arthropathy could be a cause of back pain or
referred facet syndrome pain.

L5-S1: Bilateral facet osteoarthritis with mild edema, which could
be associated with back pain or referred facet syndrome pain.

## 2019-05-30 ENCOUNTER — Other Ambulatory Visit: Payer: Self-pay | Admitting: Student

## 2019-05-30 DIAGNOSIS — K224 Dyskinesia of esophagus: Secondary | ICD-10-CM

## 2019-05-30 DIAGNOSIS — R1319 Other dysphagia: Secondary | ICD-10-CM

## 2019-05-30 DIAGNOSIS — R1310 Dysphagia, unspecified: Secondary | ICD-10-CM

## 2019-05-30 DIAGNOSIS — K219 Gastro-esophageal reflux disease without esophagitis: Secondary | ICD-10-CM

## 2019-06-02 ENCOUNTER — Ambulatory Visit: Admission: RE | Admit: 2019-06-02 | Payer: Medicare Other | Source: Ambulatory Visit

## 2019-11-08 ENCOUNTER — Other Ambulatory Visit: Payer: Self-pay | Admitting: Student

## 2019-11-08 DIAGNOSIS — K224 Dyskinesia of esophagus: Secondary | ICD-10-CM

## 2019-11-08 DIAGNOSIS — R1319 Other dysphagia: Secondary | ICD-10-CM

## 2019-11-08 DIAGNOSIS — K219 Gastro-esophageal reflux disease without esophagitis: Secondary | ICD-10-CM

## 2020-03-07 DIAGNOSIS — E440 Moderate protein-calorie malnutrition: Secondary | ICD-10-CM | POA: Insufficient documentation

## 2020-07-05 ENCOUNTER — Telehealth: Payer: Self-pay

## 2020-07-05 NOTE — Telephone Encounter (Signed)
Attempted to contact patient to schedule a Palliative Care consult appointment. No answer left a message to return call.  

## 2020-07-19 ENCOUNTER — Telehealth: Payer: Self-pay

## 2020-07-19 NOTE — Telephone Encounter (Signed)
Spoke with patient's son Madelaine Bhat regarding scheduling a Palliative Care consult appointment.Adam states he will call back once he gets home and discusses with the patient.

## 2020-07-20 ENCOUNTER — Telehealth: Payer: Self-pay | Admitting: Student

## 2020-07-20 NOTE — Telephone Encounter (Signed)
Returned call to patient's son, Hamzeh Tall, regarding Palliative referral/services and all questions were answered and he was in agreement with scheduling visit.  I have scheduled an in-home Consult for 08/08/20 @ 12:30 PM

## 2020-08-02 ENCOUNTER — Encounter (INDEPENDENT_AMBULATORY_CARE_PROVIDER_SITE_OTHER): Payer: Medicare Other | Admitting: Vascular Surgery

## 2020-08-03 DIAGNOSIS — I890 Lymphedema, not elsewhere classified: Secondary | ICD-10-CM | POA: Insufficient documentation

## 2020-08-03 NOTE — Progress Notes (Signed)
MRN : [id number]  Jesse Rivera is a 87 y.o. ([birth year]) male who presents with chief complaint of No chief complaint on file. Marland Kitchen  History of Present Illness:   Patient is seen for evaluation of leg pain and leg swelling. The patient first noticed the swelling remotely. The swelling is associated with pain and discoloration. The pain and swelling worsens with prolonged dependency and improves with elevation. The pain is unrelated to activity.  The patient notes that in the morning the legs are significantly improved but they steadily worsened throughout the course of the day. The patient also notes a steady worsening of the discoloration in the ankle and shin area.   The patient denies claudication symptoms.  The patient denies symptoms consistent with rest pain.  The patient denies and extensive history of DJD and LS spine disease.  The patient has no had any past angiography, interventions or vascular surgery.  Elevation makes the leg symptoms better, dependency makes them much worse. There is no history of ulcerations. The patient denies any recent changes in medications.  The patient has not been wearing graduated compression.  The patient denies a history of DVT or PE. There is no prior history of phlebitis. There is no history of primary lymphedema.  No history of malignancies. No history of trauma or groin or pelvic surgery. There is no history of radiation treatment to the groin or pelvis  The patient denies amaurosis fugax or recent TIA symptoms. There are no recent neurological changes noted. The patient denies recent episodes of angina or shortness of breath  No outpatient medications have been marked as taking for the 08/06/20 encounter (Appointment) with Gilda Crease, Latina Craver, MD.    Past Medical History:  Diagnosis Date  . Arthritis   . Chronic kidney disease    Stage 3  . COPD (chronic obstructive pulmonary disease) (HCC)   . Coronary artery disease   . Diabetes  mellitus without complication (HCC)   . Dysrhythmia    Atrial Fibrillation  . GERD (gastroesophageal reflux disease)   . Gouty arthropathy, chronic, without tophi   . Hyperlipidemia   . Hypertension   . Peripheral vascular disease (HCC)    Post bypass 1998  . Sleep apnea     Past Surgical History:  Procedure Laterality Date  . APPENDECTOMY    . CHOLECYSTECTOMY    . CORONARY ARTERY BYPASS GRAFT     01/12/18  . FOOT SURGERY Bilateral   . heart surgery'     triple bypass  . LEG SURGERY Left    bypass surgery  . PACEMAKER PLACEMENT    . SHOULDER SURGERY Left     Social History Social History   Tobacco Use  . Smoking status: Former Smoker    Packs/day: 1.50    Years: 48.00    Pack years: 72.00    Types: Cigarettes    Quit date: 05/12/1996    Years since quitting: 24.2  . Smokeless tobacco: Never Used  Substance Use Topics  . Alcohol use: Not Currently  . Drug use: Never    Family History Family History  Problem Relation Age of Onset  . Stroke Mother   . Heart attack Father   No family history of bleeding/clotting disorders, porphyria or autoimmune disease   Allergies  Allergen Reactions  . Cefaclor Anaphylaxis, Rash and Swelling  . Erythromycin Base Rash and Swelling  . Sulfa Antibiotics Itching, Rash and Swelling  . Aspirin   . Other Other (See Comments)  STERISTRIPS GLUE = [Rash], blisters, but tolerates paper tape NICOTINE PATCH GLUE = swelling Dental Bone Graft=itching, swelling, could not tolerate STERISTRIPS GLUE  = [Rash], blisters, but tolerates paper tape NICOTINE PATCH GLUE = swelling Dental Bone Graft=itching, swelling, could not tolerate   . Codeine Itching and Rash     REVIEW OF SYSTEMS (Negative unless checked)  Constitutional: [] Weight loss  [] Fever  [] Chills Cardiac: [] Chest pain   [] Chest pressure   [] Palpitations   [] Shortness of breath when laying flat   [] Shortness of breath with exertion. Vascular:  [x] Pain in legs with walking    [x] Pain in legs at rest  [] History of DVT   [] Phlebitis   [x] Swelling in legs   [] Varicose veins   [] Non-healing ulcers Pulmonary:   [] Uses home oxygen   [] Productive cough   [] Hemoptysis   [] Wheeze  [] COPD   [] Asthma Neurologic:  [] Dizziness   [] Seizures   [] History of stroke   [] History of TIA  [] Aphasia   [] Vissual changes   [] Weakness or numbness in arm   [] Weakness or numbness in leg Musculoskeletal:   [] Joint swelling   [x] Joint pain   [] Low back pain Hematologic:  [] Easy bruising  [] Easy bleeding   [] Hypercoagulable state   [] Anemic Gastrointestinal:  [] Diarrhea   [] Vomiting  [] Gastroesophageal reflux/heartburn   [] Difficulty swallowing. Genitourinary:  [] Chronic kidney disease   [] Difficult urination  [] Frequent urination   [] Blood in urine Skin:  [x] Rashes   [] Ulcers  Psychological:  [] History of anxiety   []  History of major depression.  Physical Examination  There were no vitals filed for this visit. There is no height or weight on file to calculate BMI. Gen: WD/WN, NAD Head: [place]/AT, No temporalis wasting.  Ear/Nose/Throat: Hearing grossly intact, nares w/o erythema or drainage, poor dentition Eyes: PER, EOMI, sclera nonicteric.  Neck: Supple, no masses.  No bruit or JVD.  Pulmonary:  Good air movement, clear to auscultation bilaterally, no use of accessory muscles.  Cardiac: RRR, normal S1, S2, no Murmurs. Vascular: scattered varicosities present bilaterally.  Mild venous stasis changes to the legs bilaterally.  2-3+ soft pitting edema Vessel Right Left  Radial Palpable Palpable  PT Not Palpable Not Palpable  DP Not Palpable Not Palpable  Gastrointestinal: soft, non-distended. No guarding/no peritoneal signs.  Musculoskeletal: M/S 5/5 throughout.  No deformity or atrophy.  Neurologic: CN 2-12 intact. Pain and light touch intact in extremities.  Symmetrical.  Speech is fluent. Motor exam as listed above. Psychiatric: Judgment intact, Mood & affect appropriate for pt's clinical  situation. Dermatologic: Venous rashes no ulcers noted.  No changes consistent with cellulitis. Lymph : + lichenification or skin changes of chronic lymphedema.  CBC No results found for: WBC, HGB, HCT, MCV, PLT  BMET No results found for: NA, K, CL, CO2, GLUCOSE, BUN, CREATININE, CALCIUM, GFRNONAA, GFRAA CrCl cannot be calculated (No successful lab value found.).  COAG No results found for: INR, PROTIME  Radiology No results found.   Assessment/Plan 1. Pain and swelling of lower leg, unspecified laterality  Recommend:  The patient has atypical pain symptoms for pure atherosclerotic disease. However, on physical exam there is evidence of mixed venous and arterial disease, given the diminished pulses and the edema associated with venous changes of the legs.  Noninvasive studies including ABI's and venous ultrasound of the legs will be obtained and the patient will follow up with me to review these studies.  I suspect the patient is c/o pseudoclaudication.  Patient should have an evaluation of his LS  spine which I defer to the primary service.  The patient should continue walking and begin a more formal exercise program. The patient should continue his antiplatelet therapy and aggressive treatment of the lipid abnormalities.  The patient should begin wearing graduated compression socks 15-20 mmHg strength to control edema.  - VAS [place] LOWER EXTREMITY VENOUS REFLUX; Future - VAS [place] ABI WITH/WO TBI; Future  2. Lymphedema  Recommend:  The patient has atypical pain symptoms for pure atherosclerotic disease. However, on physical exam there is evidence of mixed venous and arterial disease, given the diminished pulses and the edema associated with venous changes of the legs.  Noninvasive studies including ABI's and venous ultrasound of the legs will be obtained and the patient will follow up with me to review these studies.  I suspect the patient is c/o pseudoclaudication.  Patient  should have an evaluation of his LS spine which I defer to the primary service.  The patient should continue walking and begin a more formal exercise program. The patient should continue his antiplatelet therapy and aggressive treatment of the lipid abnormalities.  The patient should begin wearing graduated compression socks 15-20 mmHg strength to control edema.  - VAS [place] LOWER EXTREMITY VENOUS REFLUX; Future  3. Atherosclerosis of native artery of both lower extremities with intermittent claudication (HCC)  Recommend:  The patient has atypical pain symptoms for pure atherosclerotic disease. However, on physical exam there is evidence of mixed venous and arterial disease, given the diminished pulses and the edema associated with venous changes of the legs.  Noninvasive studies including ABI's and venous ultrasound of the legs will be obtained and the patient will follow up with me to review these studies.  I suspect the patient is c/o pseudoclaudication.  Patient should have an evaluation of his LS spine which I defer to the primary service.  The patient should continue walking and begin a more formal exercise program. The patient should continue his antiplatelet therapy and aggressive treatment of the lipid abnormalities.  The patient should begin wearing graduated compression socks 15-20 mmHg strength to control edema.  - VAS [place] ABI WITH/WO TBI; Future  4. Coronary artery disease of native artery of native heart with stable angina pectoris (HCC) Continue cardiac and antihypertensive medications as already ordered and reviewed, no changes at this time.  Continue statin as ordered and reviewed, no changes at this time  Nitrates PRN for chest pain   5. Paroxysmal atrial fibrillation (HCC) Continue antiarrhythmia medications as already ordered, these medications have been reviewed and there are no changes at this time.  Continue anticoagulation as ordered by Cardiology Service   6.  Essential hypertension Continue antihypertensive medications as already ordered, these medications have been reviewed and there are no changes at this time.     Levora Dredge, MD  08/03/2020 4:33 PM

## 2020-08-06 ENCOUNTER — Ambulatory Visit (INDEPENDENT_AMBULATORY_CARE_PROVIDER_SITE_OTHER): Payer: Medicare Other | Admitting: Vascular Surgery

## 2020-08-06 ENCOUNTER — Other Ambulatory Visit: Payer: Self-pay

## 2020-08-06 ENCOUNTER — Encounter (INDEPENDENT_AMBULATORY_CARE_PROVIDER_SITE_OTHER): Payer: Self-pay | Admitting: Vascular Surgery

## 2020-08-06 VITALS — BP 123/68 | HR 89 | Ht 69.0 in | Wt 160.0 lb

## 2020-08-06 DIAGNOSIS — I890 Lymphedema, not elsewhere classified: Secondary | ICD-10-CM | POA: Diagnosis not present

## 2020-08-06 DIAGNOSIS — I10 Essential (primary) hypertension: Secondary | ICD-10-CM

## 2020-08-06 DIAGNOSIS — I25118 Atherosclerotic heart disease of native coronary artery with other forms of angina pectoris: Secondary | ICD-10-CM | POA: Diagnosis not present

## 2020-08-06 DIAGNOSIS — M79669 Pain in unspecified lower leg: Secondary | ICD-10-CM | POA: Diagnosis not present

## 2020-08-06 DIAGNOSIS — M7989 Other specified soft tissue disorders: Secondary | ICD-10-CM

## 2020-08-06 DIAGNOSIS — I480 Paroxysmal atrial fibrillation: Secondary | ICD-10-CM

## 2020-08-06 DIAGNOSIS — I70213 Atherosclerosis of native arteries of extremities with intermittent claudication, bilateral legs: Secondary | ICD-10-CM | POA: Diagnosis not present

## 2020-08-07 ENCOUNTER — Telehealth (INDEPENDENT_AMBULATORY_CARE_PROVIDER_SITE_OTHER): Payer: Self-pay | Admitting: Vascular Surgery

## 2020-08-07 NOTE — Telephone Encounter (Signed)
Patient was seen yesterday 08/06/2020 as a new patient for Dr. Gilda Crease.  [facility] called stating patient has a new knot that appeared on right lower anterior leg, Patient states it appeared during the night  Patient didn't hit or bump leg on anything. Very tender to the touch.  About the size of an egg. Small area at top look bruised. Not open wound. [facility] wants a nurse to call them back, contact person is Morrie Sheldon and could be reached at [phone].

## 2020-08-08 ENCOUNTER — Other Ambulatory Visit: Payer: Medicare Other | Admitting: Student

## 2020-08-08 ENCOUNTER — Other Ambulatory Visit: Payer: Self-pay

## 2020-08-08 DIAGNOSIS — Z515 Encounter for palliative care: Secondary | ICD-10-CM

## 2020-08-08 NOTE — Telephone Encounter (Signed)
Left a message on home health nurse voicemail to return a call back to the office

## 2020-08-08 NOTE — Progress Notes (Signed)
[facility] Consult Note Telephone: [phone]  Fax: [phone]  PATIENT NAME: Jesse Rivera [address] [place] [address] [id number] (home)  DOB: June 20, 1933 MRN: [id number]  PRIMARY CARE PROVIDER:    Rusty Aus, MD,  [place] [address] [phone]  REFERRING PROVIDER:   Rusty Aus, MD [place],  [place] [address] [phone]  RESPONSIBLE PARTY:   Extended Emergency Contact Information Primary Emergency Contact: [place] Phone: [phone] Relation: None Secondary Emergency Contact: Yadriel, [place] Mobile Phone: [phone] Relation: Son  I met face to face with patient and wife in the home. Discussed with son Quita Skye via phone.  ASSESSMENT AND RECOMMENDATIONS:   Advance Care Planning: Visit at the request of Dr. Lanney Gins for palliative consult. Visit consisted of building trust and discussions on Palliative care medicine as specialized medical care for people living with serious illness, aimed at facilitating improved quality of life through symptoms relief, assisting with advance care planning and establishing goals of care. Education provided on Palliative Medicine vs. Hospice services. Palliative care will continue to provide support to patient, family and the medical team.  Discussed patient advanced disease processes. Explained how Palliative medicine can transition into hospice care. Wife feels that when hospice is started, people are giving up on patient. Explained how hospice can provide extra layer of care and support as patient and family as patient continues decline. Son Quita Skye seems receptive hospice. Family is to meet with pulmonology on upcoming appointment.   Goal of care: To maintain quality of life, have symptoms managed.   Directives: Introduced MOST form reviewed; family to  discuss further. Patient is unclear on CPR; education provided on what this would look like for patient if CPR performed. He does not want hospitalization. He is okay with antibiotics, IV fluids, no feeding tube.  Symptom Management:   COPD-patient with severe COPD, wears oxygen at 4lpm continuously. Has dyspnea at rest and exertion. Continue nebulizer, inhalers as directed. Patient states he is not taking prednisone; will clarify that he should be taking prednisone, giving his advanced disease, he would likely benefit from taking. Wife/family to assist with adl care needs. Declines PT evaluation.    Diabetic polyneuropathy-patient with neuropathy pain to bilateral feet; currently receiving duloxetine 29m, continue as directed. Recommend gabapentin 1076mQHS.   Hematoma to RLE-raised slightly purple area to right anterior leg measures 4cm x 3cm. Patient states it has improved in size since yesterday. Coumadin currently being held x 5 days. Monitor for falls/safety.  Follow up Palliative Care Visit: Palliative care will continue to follow for complex decision making and symptom management. Return in 4 weeks or prn.  Family /Caregiver/Community Supports: Currently has home health 2 week for wound care to lower extremities. Palliative Medicine will continue to provide support.    I spent 60 minutes providing this consultation, time includes time spent with patient/family, chart review, provider coordination, and documentation. More than 50% of the time in this consultation was spent counseling and coordinating communication.   CHIEF COMPLAINT: Palliative Medicine initial consult.  History obtained from review of EMR, discussion with primary team, and  interview with family.  Records reviewed and summarized below.  HISTORY OF PRESENT ILLNESS:  Jesse Rivera a [age].o. year old male with multiple medical problems including COPD-severe, chronic atrial fibrillation, CKD stage 3, CAD, T2DM, diabetic  polyneuropathy, PAD, gout arthropathy, hyperlipidemia, OSA. Palliative Care was  asked to follow this patient by consultation request of Rusty Aus, MD to help address advance care planning and goals of care. This is an initial visit.  Patient reports not feeling well most days. He endorses shortness of breath at rest and exertion. He wears oxygen continuously at 4lpm. Sleeps in recliner. Denies chest pain, palpitations. He does report sharp, burning intermittent pain to bilateral feet. Pain is severe at times. Home health nurse is coming out 2 times a week to provide wound care to lower extremities. Patient has a "knot" that he first noticed yesterday; he has been instructed to hold his coumadin for 5 days. He states area has decreased in size from yesterday to today. He endorses an "okay" appetite although he has lost around 25 pounds in the past 3-4 months. He reports a fall backwards one week ago.   CODE STATUS: Full Code  PPS: 40%  HOSPICE ELIGIBILITY/DIAGNOSIS: COPD  ROS   General: NAD EYES: denies vision changes ENMT: denies dysphagia Cardiovascular: denies chest pain Pulmonary: productive cough, no change in cough; SOB at rest and exertion Abdomen: endorses fair appetite GU: denies dysuria MSK: LE weakness, fall 1 week ago reported Skin: wounds to legs, care per nurse twice weekly Neurological: endorses weakness, sharp, burning pain to bilateral feet Psych: Endorses positive mood Heme/lymph/immuno: denies abnormal bleeding   Physical Exam: Weight 160 pounds Pulse 60, resp 20, b/p 120/62, sats 98% at 4lpm Constitutional: NAD General: frail, chronically ill appearing EYES: anicteric sclera, lids intact, no discharge  ENMT: hard of hearing,oral mucous membranes moist CV: RRR,  1-2+ LE edema Pulmonary: Lungs clear, diminished bilaterally,  increased work of breathing with exertion, no cough, no audible wheezes Abdomen: BS normoactive x 4 GU: deferred MSK: moves all  extremities, no contractures of LE, ambulatory Skin: warm and dry, purple hematoma to right anterior medial leg, 4cm x 3cm, scab to RLE, dressing CDI to LLE Neuro: Generalized weakness Psych: non-anxious affect today Hem/lymph/immuno: no widespread bruising   PAST MEDICAL HISTORY:  Past Medical History:  Diagnosis Date  . Arthritis   . Chronic kidney disease    Stage 3  . COPD (chronic obstructive pulmonary disease) ([facility])   . Coronary artery disease   . Diabetes mellitus without complication ([facility])   . Dysrhythmia    Atrial Fibrillation  . GERD (gastroesophageal reflux disease)   . Gouty arthropathy, chronic, without tophi   . Hyperlipidemia   . Hypertension   . Peripheral vascular disease ([facility])    Post bypass 1998  . Sleep apnea     SOCIAL HX:  Social History   Tobacco Use  . Smoking status: Former Smoker    Packs/day: 1.50    Years: 48.00    Pack years: 72.00    Types: Cigarettes    Quit date: 05/12/1996    Years since quitting: 24.2  . Smokeless tobacco: Never Used  Substance Use Topics  . Alcohol use: Not Currently   FAMILY HX:  Family History  Problem Relation Age of Onset  . Stroke Mother   . Heart attack Father     ALLERGIES:  Allergies  Allergen Reactions  . Cefaclor Anaphylaxis, Rash and Swelling  . Erythromycin Base Rash and Swelling  . Sulfa Antibiotics Itching, Rash and Swelling  . Aspirin   . Other Other (See Comments)    STERISTRIPS GLUE = [Rash], blisters, but tolerates paper tape NICOTINE PATCH GLUE = swelling Dental Bone Graft=itching, swelling, could not tolerate STERISTRIPS GLUE  = [Rash], blisters,  but tolerates paper tape NICOTINE PATCH GLUE = swelling Dental Bone Graft=itching, swelling, could not tolerate   . Codeine Itching and Rash     PERTINENT MEDICATIONS:  Outpatient Encounter Medications as of 08/08/2020  Medication Sig  . budesonide (PULMICORT) 0.5 MG/2ML nebulizer solution   . albuterol (ACCUNEB) 1.25 MG/3ML nebulizer  solution Inhale into the lungs.  Marland Kitchen albuterol (VENTOLIN HFA) 108 (90 Base) MCG/ACT inhaler Inhale into the lungs.  Marland Kitchen allopurinol (ZYLOPRIM) 100 MG tablet 1 tablet once daily  . aspirin EC 81 MG tablet Take 81 mg by mouth daily.  Marland Kitchen atorvastatin (LIPITOR) 10 MG tablet TK 1 T PO QD  . BD PEN NEEDLE NANO U/F 32G X 4 MM MISC U TO INJECT INSULIN D UTD  . budesonide-formoterol (SYMBICORT) 160-4.5 MCG/ACT inhaler Inhale 2 puffs into the lungs 2 (two) times daily.  . Cholecalciferol 25 MCG (1000 UT) tablet Take by mouth.  . clobetasol ointment (TEMOVATE) 0.05 % Apply to psoriasis areas on the body 1-2 times daily. Never apply to face.  . COMBIVENT RESPIMAT 20-100 MCG/ACT AERS respimat Inhale 1 puff into the lungs 4 (four) times daily.  . cyanocobalamin 100 MCG tablet Take by mouth.  . cyclobenzaprine (FLEXERIL) 5 MG tablet Take by mouth.  . Cysteamine Bitartrate (PROCYSBI) 300 MG PACK 3 (three) times daily To check blood sugar. diagnosis code: E11.42   Contour Next  . diclofenac sodium (VOLTAREN) 1 % GEL APPLY A SMALL AMOUNT(2 GRAMS) TO THE AFFECTED AREA EVERY 12 HOURS AS NEEDED. 25 day supply  . doxycycline (VIBRA-TABS) 100 MG tablet Take 100 mg by mouth 2 (two) times daily.  Marland Kitchen doxycycline (VIBRAMYCIN) 25 MG/5ML SUSR SMARTSIG:Milliliter(s) By Mouth  . DULoxetine (CYMBALTA) 30 MG capsule Take by mouth.  . DULoxetine (CYMBALTA) 30 MG capsule Take 30 mg by mouth daily.  . fenofibrate (TRICOR) 48 MG tablet TK 1 T PO QD  . Fluocinolone Acetonide Scalp 0.01 % OIL Apply to the ears daily as needed for itching.  . fluticasone (FLONASE) 50 MCG/ACT nasal spray 2 sprays once daily  . furosemide (LASIX) 40 MG tablet 1 tablet once daily  . glucose blood (PRECISION QID TEST) test strip Use 1 each (1 strip total) 3 (three) times daily To check blood sugar. diagnosis code: E11.42  Contour Next  . Guaifenesin 1200 MG TB12 Take by mouth.  . hydrocortisone 2.5 % cream Apply to face 1-2 times daily  . hydrOXYzine  (ATARAX/VISTARIL) 25 MG tablet TAKE 1 TABLET(25 MG) BY MOUTH TWICE DAILY AS NEEDED  . hydrOXYzine (ATARAX/VISTARIL) 25 MG tablet Take 25 mg by mouth 2 (two) times daily.  . insulin glargine (LANTUS) 100 UNIT/ML Solostar Pen Inject into the skin.  . Ipratropium-Albuterol (COMBIVENT RESPIMAT) 20-100 MCG/ACT AERS respimat INHALE 2 INHALATIONS INTO THE LUNGS FOUR TIMES DAILY  . isosorbide mononitrate (IMDUR) 30 MG 24 hr tablet 60 mg.   . ketoconazole (NIZORAL) 2 % shampoo   . LANTUS SOLOSTAR 100 UNIT/ML Solostar Pen ADM 52 UNI Ray D  . magnesium oxide (MAG-OX) 400 MG tablet   . metolazone (ZAROXOLYN) 2.5 MG tablet TAKE 1 TABLET(2.5 MG) BY MOUTH EVERY DAY  . metolazone (ZAROXOLYN) 2.5 MG tablet Take 2.5 mg by mouth daily.  . metoprolol succinate (TOPROL-XL) 25 MG 24 hr tablet TK 1 T PO D  . montelukast (SINGULAIR) 10 MG tablet   . Nebulizers (VIOS LC SPRINT DELUXE) MISC USE TO ADMINISTER NEBULIZER SOLUTIONS  . nitroGLYCERIN (NITROSTAT) 0.4 MG SL tablet DISSOLVE 1 T UNDER TONGUE Q  5 MINUTES UP TO 3 DOSES  . omeprazole (PRILOSEC) 40 MG capsule 1 capsule 2 (two) times daily  . predniSONE (DELTASONE) 10 MG tablet Take 10 mg by mouth daily.  . predniSONE (DELTASONE) 20 MG tablet Take 20 mg by mouth daily.  . tamsulosin (FLOMAX) 0.4 MG CAPS capsule TK 2 CS PO D  . TIKOSYN 250 MCG capsule TK 1 C PO BID  . tiotropium (SPIRIVA) 18 MCG inhalation capsule Place into inhaler and inhale.  . torsemide (DEMADEX) 20 MG tablet Take by mouth.  . torsemide (DEMADEX) 20 MG tablet Take 20 mg by mouth daily.  . VENTOLIN HFA 108 (90 Base) MCG/ACT inhaler SMARTSIG:2 Inhalation Via Inhaler 4 Times Daily  . warfarin (COUMADIN) 2.5 MG tablet Take by mouth.   No facility-administered encounter medications on file as of 08/08/2020.     Thank you for the opportunity to participate in the care of Mr. Wirz. The palliative care team will continue to follow. Please call our office at [phone] if we can be of additional  assistance.  Ezekiel Slocumb, NP

## 2020-08-12 ENCOUNTER — Encounter (INDEPENDENT_AMBULATORY_CARE_PROVIDER_SITE_OTHER): Payer: Self-pay | Admitting: Vascular Surgery

## 2020-08-12 DIAGNOSIS — M7989 Other specified soft tissue disorders: Secondary | ICD-10-CM | POA: Insufficient documentation

## 2020-08-12 DIAGNOSIS — M79669 Pain in unspecified lower leg: Secondary | ICD-10-CM | POA: Insufficient documentation

## 2020-08-22 NOTE — Progress Notes (Signed)
MRN : [id number]  Jesse Rivera is a 87 y.o. (1933/07/22) male who presents with chief complaint of No chief complaint on file. Marland Kitchen  History of Present Illness:   The patient returns to the office for followup evaluation regarding increasing left lower extremity pain in association with bilateral leg swelling.  The swelling has persisted and the pain associated with swelling continues. There have not been any interval development of a ulcerations or wounds.  Since the previous visit the patient has been wearing graduated compression stockings and has noted little if any improvement in the lymphedema. The patient has been using compression routinely morning until night.  The patient also states elevation during the day and exercise is being done too.  Venous duplex done today shows normal deep venous system bilaterally with patent great saphenous vein on the right no evidence of significant reflux saphenous vein on the left has a history of prior ablation and remains occluded.  ABI's done today Rt= noncompressible TBI equals 0.40 and Lt= noncompressible TBI equals 0.18 toe tracings are flat on the left  No outpatient medications have been marked as taking for the 08/23/20 encounter (Appointment) with Gilda Crease, Latina Craver, MD.    Past Medical History:  Diagnosis Date  . Arthritis   . Chronic kidney disease    Stage 3  . COPD (chronic obstructive pulmonary disease) ([facility])   . Coronary artery disease   . Diabetes mellitus without complication ([facility])   . Dysrhythmia    Atrial Fibrillation  . GERD (gastroesophageal reflux disease)   . Gouty arthropathy, chronic, without tophi   . Hyperlipidemia   . Hypertension   . Peripheral vascular disease ([facility])    Post bypass 1998  . Sleep apnea     Past Surgical History:  Procedure Laterality Date  . APPENDECTOMY    . CHOLECYSTECTOMY    . CORONARY ARTERY BYPASS GRAFT     01/12/18  . FOOT SURGERY Bilateral   . heart surgery'     triple bypass   . LEG SURGERY Left    bypass surgery  . PACEMAKER PLACEMENT    . SHOULDER SURGERY Left     Social History Social History   Tobacco Use  . Smoking status: Former Smoker    Packs/day: 1.50    Years: 48.00    Pack years: 72.00    Types: Cigarettes    Quit date: 05/12/1996    Years since quitting: 24.2  . Smokeless tobacco: Never Used  Substance Use Topics  . Alcohol use: Not Currently  . Drug use: Never    Family History Family History  Problem Relation Age of Onset  . Stroke Mother   . Heart attack Father     Allergies  Allergen Reactions  . Cefaclor Anaphylaxis, Rash and Swelling  . Erythromycin Base Rash and Swelling  . Sulfa Antibiotics Itching, Rash and Swelling  . Aspirin   . Other Other (See Comments)    STERISTRIPS GLUE = [Rash], blisters, but tolerates paper tape NICOTINE PATCH GLUE = swelling Dental Bone Graft=itching, swelling, could not tolerate STERISTRIPS GLUE  = [Rash], blisters, but tolerates paper tape NICOTINE PATCH GLUE = swelling Dental Bone Graft=itching, swelling, could not tolerate   . Codeine Itching and Rash     REVIEW OF SYSTEMS (Negative unless checked)  Constitutional: [] Weight loss  [] Fever  [] Chills Cardiac: [] Chest pain   [] Chest pressure   [] Palpitations   [] Shortness of breath when laying flat   [] Shortness of breath with exertion.  Vascular:  [x] Pain in legs with walking   [x] Pain in legs at rest  [] History of DVT   [] Phlebitis   [x] Swelling in legs   [] Varicose veins   [] Non-healing ulcers Pulmonary:   [] Uses home oxygen   [] Productive cough   [] Hemoptysis   [] Wheeze  [] COPD   [] Asthma Neurologic:  [] Dizziness   [] Seizures   [] History of stroke   [] History of TIA  [] Aphasia   [] Vissual changes   [] Weakness or numbness in arm   [] Weakness or numbness in leg Musculoskeletal:   [] Joint swelling   [] Joint pain   [] Low back pain Hematologic:  [] Easy bruising  [] Easy bleeding   [] Hypercoagulable state   [] Anemic Gastrointestinal:   [] Diarrhea   [] Vomiting  [] Gastroesophageal reflux/heartburn   [] Difficulty swallowing. Genitourinary:  [] Chronic kidney disease   [] Difficult urination  [] Frequent urination   [] Blood in urine Skin:  [] Rashes   [] Ulcers  Psychological:  [] History of anxiety   []  History of major depression.  Physical Examination  There were no vitals filed for this visit. There is no height or weight on file to calculate BMI. Gen: WD/WN, NAD Head: [place]/AT, No temporalis wasting.  Ear/Nose/Throat: Hearing grossly intact, nares w/o erythema or drainage Eyes: PER, EOMI, sclera nonicteric.  Neck: Supple, no large masses.   Pulmonary:  Good air movement, no audible wheezing bilaterally, no use of accessory muscles.  Cardiac: RRR, no JVD Vascular: scattered varicosities present bilaterally.  Moderate venous stasis changes to the legs bilaterally.  3+ soft pitting edema Vessel Right Left  Radial Palpable Palpable  PT Not Palpable Not Palpable  DP Not Palpable Not Palpable  Gastrointestinal: Non-distended. No guarding/no peritoneal signs.  Musculoskeletal: M/S 5/5 throughout.  No deformity or atrophy.  Neurologic: CN 2-12 intact. Symmetrical.  Speech is fluent. Motor exam as listed above. Psychiatric: Judgment intact, Mood & affect appropriate for pt's clinical situation. Dermatologic: Venous rashes no ulcers noted.  No changes consistent with cellulitis. Lymph : + lichenification / skin changes of chronic lymphedema.  CBC No results found for: WBC, HGB, HCT, MCV, PLT  BMET No results found for: NA, K, CL, CO2, GLUCOSE, BUN, CREATININE, CALCIUM, GFRNONAA, GFRAA CrCl cannot be calculated (No successful lab value found.).  COAG No results found for: INR, PROTIME  Radiology No results found.   Assessment/Plan 1. Pain and swelling of lower leg, unspecified laterality Recommend:  Although his edema is a issue that we are treating it does not appear to be the etiology of his leg pain.  Noninvasive test  today demonstrate profound atherosclerotic changes of the left lower extremity in particular.  I still recommend compression we will likely move forward with a lymph pump given the degree of his edema but his arterial insufficiency needs to be addressed first.  The patient has evidence of severe atherosclerotic changes of both lower extremities with rest pain that is associated with preulcerative changes and impending tissue loss of the left foot.  This represents a limb threatening ischemia and places the patient at the risk for left limb loss.  Patient should undergo angiography of the left lower extremities with the hope for intervention for limb salvage.  The risks and benefits as well as the alternative therapies was discussed in detail with the patient.  All questions were answered.  Patient agrees to proceed with left lower extremity angiography.  The patient will follow up with me in the office after the procedure.     2. 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 Coronary artery disease of native artery of native heart with stable angina pectoris ([facility]) Continue cardiac and antihypertensive medications as already ordered and reviewed, no changes at this time.  Continue statin as ordered and reviewed, no changes at this time  Nitrates PRN for chest pain   4. Paroxysmal atrial fibrillation ([facility]) Continue antiarrhythmia medications as already ordered, these medications have been reviewed and there are no changes at this time.  Continue anticoagulation as ordered by Cardiology Service   5. Essential hypertension Continue antihypertensive medications as already ordered, these medications have been reviewed and there are no changes at this time.     Karen Kinnard, MD  08/22/2020 11:41 PM 

## 2020-08-22 NOTE — H&P (View-Only) (Signed)
MRN : [id number]  Jesse Rivera is a 87 y.o. (1933/07/22) male who presents with chief complaint of No chief complaint on file. Marland Kitchen  History of Present Illness:   The patient returns to the office for followup evaluation regarding increasing left lower extremity pain in association with bilateral leg swelling.  The swelling has persisted and the pain associated with swelling continues. There have not been any interval development of a ulcerations or wounds.  Since the previous visit the patient has been wearing graduated compression stockings and has noted little if any improvement in the lymphedema. The patient has been using compression routinely morning until night.  The patient also states elevation during the day and exercise is being done too.  Venous duplex done today shows normal deep venous system bilaterally with patent great saphenous vein on the right no evidence of significant reflux saphenous vein on the left has a history of prior ablation and remains occluded.  ABI's done today Rt= noncompressible TBI equals 0.40 and Lt= noncompressible TBI equals 0.18 toe tracings are flat on the left  No outpatient medications have been marked as taking for the 08/23/20 encounter (Appointment) with Gilda Crease, Latina Craver, MD.    Past Medical History:  Diagnosis Date  . Arthritis   . Chronic kidney disease    Stage 3  . COPD (chronic obstructive pulmonary disease) (HCC)   . Coronary artery disease   . Diabetes mellitus without complication (HCC)   . Dysrhythmia    Atrial Fibrillation  . GERD (gastroesophageal reflux disease)   . Gouty arthropathy, chronic, without tophi   . Hyperlipidemia   . Hypertension   . Peripheral vascular disease (HCC)    Post bypass 1998  . Sleep apnea     Past Surgical History:  Procedure Laterality Date  . APPENDECTOMY    . CHOLECYSTECTOMY    . CORONARY ARTERY BYPASS GRAFT     01/12/18  . FOOT SURGERY Bilateral   . heart surgery'     triple bypass   . LEG SURGERY Left    bypass surgery  . PACEMAKER PLACEMENT    . SHOULDER SURGERY Left     Social History Social History   Tobacco Use  . Smoking status: Former Smoker    Packs/day: 1.50    Years: 48.00    Pack years: 72.00    Types: Cigarettes    Quit date: 05/12/1996    Years since quitting: 24.2  . Smokeless tobacco: Never Used  Substance Use Topics  . Alcohol use: Not Currently  . Drug use: Never    Family History Family History  Problem Relation Age of Onset  . Stroke Mother   . Heart attack Father     Allergies  Allergen Reactions  . Cefaclor Anaphylaxis, Rash and Swelling  . Erythromycin Base Rash and Swelling  . Sulfa Antibiotics Itching, Rash and Swelling  . Aspirin   . Other Other (See Comments)    STERISTRIPS GLUE = [Rash], blisters, but tolerates paper tape NICOTINE PATCH GLUE = swelling Dental Bone Graft=itching, swelling, could not tolerate STERISTRIPS GLUE  = [Rash], blisters, but tolerates paper tape NICOTINE PATCH GLUE = swelling Dental Bone Graft=itching, swelling, could not tolerate   . Codeine Itching and Rash     REVIEW OF SYSTEMS (Negative unless checked)  Constitutional: [] Weight loss  [] Fever  [] Chills Cardiac: [] Chest pain   [] Chest pressure   [] Palpitations   [] Shortness of breath when laying flat   [] Shortness of breath with exertion.  Vascular:  [x] Pain in legs with walking   [x] Pain in legs at rest  [] History of DVT   [] Phlebitis   [x] Swelling in legs   [] Varicose veins   [] Non-healing ulcers Pulmonary:   [] Uses home oxygen   [] Productive cough   [] Hemoptysis   [] Wheeze  [] COPD   [] Asthma Neurologic:  [] Dizziness   [] Seizures   [] History of stroke   [] History of TIA  [] Aphasia   [] Vissual changes   [] Weakness or numbness in arm   [] Weakness or numbness in leg Musculoskeletal:   [] Joint swelling   [] Joint pain   [] Low back pain Hematologic:  [] Easy bruising  [] Easy bleeding   [] Hypercoagulable state   [] Anemic Gastrointestinal:   [] Diarrhea   [] Vomiting  [] Gastroesophageal reflux/heartburn   [] Difficulty swallowing. Genitourinary:  [] Chronic kidney disease   [] Difficult urination  [] Frequent urination   [] Blood in urine Skin:  [] Rashes   [] Ulcers  Psychological:  [] History of anxiety   []  History of major depression.  Physical Examination  There were no vitals filed for this visit. There is no height or weight on file to calculate BMI. Gen: WD/WN, NAD Head: [place]/AT, No temporalis wasting.  Ear/Nose/Throat: Hearing grossly intact, nares w/o erythema or drainage Eyes: PER, EOMI, sclera nonicteric.  Neck: Supple, no large masses.   Pulmonary:  Good air movement, no audible wheezing bilaterally, no use of accessory muscles.  Cardiac: RRR, no JVD Vascular: scattered varicosities present bilaterally.  Moderate venous stasis changes to the legs bilaterally.  3+ soft pitting edema Vessel Right Left  Radial Palpable Palpable  PT Not Palpable Not Palpable  DP Not Palpable Not Palpable  Gastrointestinal: Non-distended. No guarding/no peritoneal signs.  Musculoskeletal: M/S 5/5 throughout.  No deformity or atrophy.  Neurologic: CN 2-12 intact. Symmetrical.  Speech is fluent. Motor exam as listed above. Psychiatric: Judgment intact, Mood & affect appropriate for pt's clinical situation. Dermatologic: Venous rashes no ulcers noted.  No changes consistent with cellulitis. Lymph : + lichenification / skin changes of chronic lymphedema.  CBC No results found for: WBC, HGB, HCT, MCV, PLT  BMET No results found for: NA, K, CL, CO2, GLUCOSE, BUN, CREATININE, CALCIUM, GFRNONAA, GFRAA CrCl cannot be calculated (No successful lab value found.).  COAG No results found for: INR, PROTIME  Radiology No results found.   Assessment/Plan 1. Pain and swelling of lower leg, unspecified laterality Recommend:  Although his edema is a issue that we are treating it does not appear to be the etiology of his leg pain.  Noninvasive test  today demonstrate profound atherosclerotic changes of the left lower extremity in particular.  I still recommend compression we will likely move forward with a lymph pump given the degree of his edema but his arterial insufficiency needs to be addressed first.  The patient has evidence of severe atherosclerotic changes of both lower extremities with rest pain that is associated with preulcerative changes and impending tissue loss of the left foot.  This represents a limb threatening ischemia and places the patient at the risk for left limb loss.  Patient should undergo angiography of the left lower extremities with the hope for intervention for limb salvage.  The risks and benefits as well as the alternative therapies was discussed in detail with the patient.  All questions were answered.  Patient agrees to proceed with left lower extremity angiography.  The patient will follow up with me in the office after the procedure.     2. 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 Coronary artery disease of native artery of native heart with stable angina pectoris (HCC) Continue cardiac and antihypertensive medications as already ordered and reviewed, no changes at this time.  Continue statin as ordered and reviewed, no changes at this time  Nitrates PRN for chest pain   4. Paroxysmal atrial fibrillation (HCC) Continue antiarrhythmia medications as already ordered, these medications have been reviewed and there are no changes at this time.  Continue anticoagulation as ordered by Cardiology Service   5. Essential hypertension Continue antihypertensive medications as already ordered, these medications have been reviewed and there are no changes at this time.     Levora Dredge, MD  08/22/2020 11:41 PM

## 2020-08-23 ENCOUNTER — Encounter (INDEPENDENT_AMBULATORY_CARE_PROVIDER_SITE_OTHER): Payer: Self-pay | Admitting: Vascular Surgery

## 2020-08-23 ENCOUNTER — Ambulatory Visit (INDEPENDENT_AMBULATORY_CARE_PROVIDER_SITE_OTHER): Payer: Medicare Other

## 2020-08-23 ENCOUNTER — Other Ambulatory Visit: Payer: Self-pay

## 2020-08-23 ENCOUNTER — Telehealth (INDEPENDENT_AMBULATORY_CARE_PROVIDER_SITE_OTHER): Payer: Self-pay

## 2020-08-23 ENCOUNTER — Ambulatory Visit (INDEPENDENT_AMBULATORY_CARE_PROVIDER_SITE_OTHER): Payer: Medicare Other | Admitting: Vascular Surgery

## 2020-08-23 VITALS — BP 134/76 | HR 98 | Resp 16

## 2020-08-23 DIAGNOSIS — I70222 Atherosclerosis of native arteries of extremities with rest pain, left leg: Secondary | ICD-10-CM | POA: Diagnosis not present

## 2020-08-23 DIAGNOSIS — M79669 Pain in unspecified lower leg: Secondary | ICD-10-CM | POA: Diagnosis not present

## 2020-08-23 DIAGNOSIS — I25118 Atherosclerotic heart disease of native coronary artery with other forms of angina pectoris: Secondary | ICD-10-CM

## 2020-08-23 DIAGNOSIS — I480 Paroxysmal atrial fibrillation: Secondary | ICD-10-CM | POA: Diagnosis not present

## 2020-08-23 DIAGNOSIS — I70213 Atherosclerosis of native arteries of extremities with intermittent claudication, bilateral legs: Secondary | ICD-10-CM

## 2020-08-23 DIAGNOSIS — M7989 Other specified soft tissue disorders: Secondary | ICD-10-CM | POA: Diagnosis not present

## 2020-08-23 DIAGNOSIS — I890 Lymphedema, not elsewhere classified: Secondary | ICD-10-CM | POA: Diagnosis not present

## 2020-08-23 DIAGNOSIS — R600 Localized edema: Secondary | ICD-10-CM

## 2020-08-23 DIAGNOSIS — I10 Essential (primary) hypertension: Secondary | ICD-10-CM

## 2020-08-23 NOTE — Telephone Encounter (Signed)
Spoke with the patient's son and the patient is scheduled for a left leg angio on 09/11/20 with Dr. Gilda Crease with a 8:00 am arrival time to the [facility]. Covid testing on 09/07/20 between 8-2 pm at the [facility]. Pre-procedure instructions were discussed and will be mailed.

## 2020-09-05 ENCOUNTER — Encounter (INDEPENDENT_AMBULATORY_CARE_PROVIDER_SITE_OTHER): Payer: Self-pay | Admitting: Vascular Surgery

## 2020-09-05 DIAGNOSIS — I70229 Atherosclerosis of native arteries of extremities with rest pain, unspecified extremity: Secondary | ICD-10-CM | POA: Insufficient documentation

## 2020-09-06 ENCOUNTER — Telehealth (INDEPENDENT_AMBULATORY_CARE_PROVIDER_SITE_OTHER): Payer: Self-pay

## 2020-09-06 NOTE — Telephone Encounter (Signed)
Pt's home care nurse called to make our office aware that the pt has two sores on his Lt get an he is scheduled for an angio next week on the Lt leg.

## 2020-09-06 NOTE — Telephone Encounter (Signed)
No changes with current course of plan.  Angio should help with sores

## 2020-09-07 ENCOUNTER — Other Ambulatory Visit
Admission: RE | Admit: 2020-09-07 | Discharge: 2020-09-07 | Disposition: A | Discharge status: Home / Self Care | Payer: Medicare Other | Source: Ambulatory Visit | Attending: Vascular Surgery | Admitting: Vascular Surgery

## 2020-09-07 ENCOUNTER — Other Ambulatory Visit: Payer: Self-pay

## 2020-09-07 DIAGNOSIS — Z01812 Encounter for preprocedural laboratory examination: Secondary | ICD-10-CM | POA: Insufficient documentation

## 2020-09-07 DIAGNOSIS — Z20822 Contact with and (suspected) exposure to covid-19: Secondary | ICD-10-CM | POA: Insufficient documentation

## 2020-09-07 LAB — SARS CORONAVIRUS 2 (TAT 6-24 HRS): SARS Coronavirus 2: NEGATIVE

## 2020-09-07 NOTE — Telephone Encounter (Signed)
I called the pt and made him aware of the Np's instructions. 

## 2020-09-11 ENCOUNTER — Other Ambulatory Visit (INDEPENDENT_AMBULATORY_CARE_PROVIDER_SITE_OTHER): Payer: Self-pay | Admitting: Nurse Practitioner

## 2020-09-11 ENCOUNTER — Ambulatory Visit
Admission: RE | Admit: 2020-09-11 | Discharge: 2020-09-11 | Disposition: A | Discharge status: Home / Self Care | Payer: Medicare Other | Attending: Vascular Surgery | Admitting: Vascular Surgery

## 2020-09-11 ENCOUNTER — Encounter: Payer: Self-pay | Admitting: Vascular Surgery

## 2020-09-11 ENCOUNTER — Other Ambulatory Visit: Payer: Self-pay

## 2020-09-11 ENCOUNTER — Encounter
Admission: RE | Disposition: A | Discharge status: Home / Self Care | Payer: Self-pay | Source: Home / Self Care | Attending: Vascular Surgery

## 2020-09-11 DIAGNOSIS — I480 Paroxysmal atrial fibrillation: Secondary | ICD-10-CM | POA: Insufficient documentation

## 2020-09-11 DIAGNOSIS — E1151 Type 2 diabetes mellitus with diabetic peripheral angiopathy without gangrene: Secondary | ICD-10-CM | POA: Insufficient documentation

## 2020-09-11 DIAGNOSIS — N183 Chronic kidney disease, stage 3 unspecified: Secondary | ICD-10-CM | POA: Insufficient documentation

## 2020-09-11 DIAGNOSIS — Z885 Allergy status to narcotic agent status: Secondary | ICD-10-CM | POA: Insufficient documentation

## 2020-09-11 DIAGNOSIS — E1122 Type 2 diabetes mellitus with diabetic chronic kidney disease: Secondary | ICD-10-CM | POA: Diagnosis not present

## 2020-09-11 DIAGNOSIS — I25118 Atherosclerotic heart disease of native coronary artery with other forms of angina pectoris: Secondary | ICD-10-CM | POA: Diagnosis not present

## 2020-09-11 DIAGNOSIS — Z87891 Personal history of nicotine dependence: Secondary | ICD-10-CM | POA: Insufficient documentation

## 2020-09-11 DIAGNOSIS — I70245 Atherosclerosis of native arteries of left leg with ulceration of other part of foot: Secondary | ICD-10-CM | POA: Insufficient documentation

## 2020-09-11 DIAGNOSIS — L97529 Non-pressure chronic ulcer of other part of left foot with unspecified severity: Secondary | ICD-10-CM | POA: Diagnosis not present

## 2020-09-11 DIAGNOSIS — I70201 Unspecified atherosclerosis of native arteries of extremities, right leg: Secondary | ICD-10-CM | POA: Diagnosis not present

## 2020-09-11 DIAGNOSIS — I70219 Atherosclerosis of native arteries of extremities with intermittent claudication, unspecified extremity: Secondary | ICD-10-CM

## 2020-09-11 DIAGNOSIS — I129 Hypertensive chronic kidney disease with stage 1 through stage 4 chronic kidney disease, or unspecified chronic kidney disease: Secondary | ICD-10-CM | POA: Insufficient documentation

## 2020-09-11 DIAGNOSIS — I890 Lymphedema, not elsewhere classified: Secondary | ICD-10-CM | POA: Insufficient documentation

## 2020-09-11 DIAGNOSIS — Z886 Allergy status to analgesic agent status: Secondary | ICD-10-CM | POA: Diagnosis not present

## 2020-09-11 DIAGNOSIS — Z882 Allergy status to sulfonamides status: Secondary | ICD-10-CM | POA: Diagnosis not present

## 2020-09-11 DIAGNOSIS — E11621 Type 2 diabetes mellitus with foot ulcer: Secondary | ICD-10-CM | POA: Insufficient documentation

## 2020-09-11 HISTORY — PX: LOWER EXTREMITY ANGIOGRAPHY: CATH118251

## 2020-09-11 LAB — CREATININE, SERUM
Creatinine, Ser: 1.79 mg/dL — ABNORMAL HIGH (ref 0.61–1.24)
GFR, Estimated: 37 mL/min — ABNORMAL LOW (ref >60)

## 2020-09-11 LAB — GLUCOSE, CAPILLARY
Glucose-Capillary: 122 mg/dL — ABNORMAL HIGH (ref 70–99)
Glucose-Capillary: 115 mg/dL — ABNORMAL HIGH (ref 70–99)

## 2020-09-11 LAB — BUN: BUN: 36 mg/dL — ABNORMAL HIGH (ref 8–23)

## 2020-09-11 SURGERY — LOWER EXTREMITY ANGIOGRAPHY
Anesthesia: Moderate Sedation | Site: Leg Lower | Laterality: Left

## 2020-09-11 MED ORDER — MIDAZOLAM HCL 2 MG/ML PO SYRP: 8.0000 mg | ORAL_SOLUTION | Freq: Once | ORAL | Status: DC | PRN

## 2020-09-11 MED ORDER — CLINDAMYCIN PHOSPHATE 300 MG/50ML IV SOLN
INTRAVENOUS | Status: AC
  Filled 2020-09-11: qty 50

## 2020-09-11 MED ORDER — ACETAMINOPHEN 325 MG PO TABS
650.0000 mg | ORAL_TABLET | ORAL | Status: DC | PRN
  Administered 2020-09-11: 650 mg via ORAL

## 2020-09-11 MED ORDER — HYDRALAZINE HCL 20 MG/ML IJ SOLN: 5.0000 mg | INTRAMUSCULAR | Status: DC | PRN

## 2020-09-11 MED ORDER — LABETALOL HCL 5 MG/ML IV SOLN: 10.0000 mg | INTRAVENOUS | Status: DC | PRN

## 2020-09-11 MED ORDER — FENTANYL CITRATE (PF) 100 MCG/2ML IJ SOLN
INTRAMUSCULAR | Status: DC | PRN
  Administered 2020-09-11: 25 ug via INTRAVENOUS
  Administered 2020-09-11: 50 ug via INTRAVENOUS

## 2020-09-11 MED ORDER — OXYCODONE HCL 5 MG PO TABS: 5.0000 mg | ORAL_TABLET | ORAL | Status: DC | PRN

## 2020-09-11 MED ORDER — HEPARIN SODIUM (PORCINE) 1000 UNIT/ML IJ SOLN
INTRAMUSCULAR | Status: AC
  Filled 2020-09-11: qty 1

## 2020-09-11 MED ORDER — HEPARIN SODIUM (PORCINE) 1000 UNIT/ML IJ SOLN
INTRAMUSCULAR | Status: DC | PRN
  Administered 2020-09-11: 5000 [IU] via INTRAVENOUS

## 2020-09-11 MED ORDER — IODIXANOL 320 MG/ML IV SOLN
INTRAVENOUS | Status: DC | PRN
  Administered 2020-09-11: 90 mL

## 2020-09-11 MED ORDER — MIDAZOLAM HCL 2 MG/2ML IJ SOLN
INTRAMUSCULAR | Status: DC | PRN
  Administered 2020-09-11: 2 mg via INTRAVENOUS
  Administered 2020-09-11: 1 mg via INTRAVENOUS

## 2020-09-11 MED ORDER — ONDANSETRON HCL 4 MG/2ML IJ SOLN: 4.0000 mg | Freq: Four times a day (QID) | INTRAMUSCULAR | Status: DC | PRN

## 2020-09-11 MED ORDER — ONDANSETRON HCL 4 MG/2ML IJ SOLN
4.0000 mg | Freq: Four times a day (QID) | INTRAMUSCULAR | Status: DC | PRN
Start: 2020-09-11 — End: 2020-09-11

## 2020-09-11 MED ORDER — FENTANYL CITRATE (PF) 100 MCG/2ML IJ SOLN: 12.5000 ug | Freq: Once | INTRAMUSCULAR | Status: DC | PRN

## 2020-09-11 MED ORDER — FENTANYL CITRATE (PF) 100 MCG/2ML IJ SOLN
INTRAMUSCULAR | Status: AC
  Filled 2020-09-11: qty 2

## 2020-09-11 MED ORDER — SODIUM CHLORIDE 0.9% FLUSH: 3.0000 mL | Freq: Two times a day (BID) | INTRAVENOUS | Status: DC

## 2020-09-11 MED ORDER — CLOPIDOGREL BISULFATE 75 MG PO TABS
150.0000 mg | ORAL_TABLET | ORAL | Status: AC
  Administered 2020-09-11: 150 mg via ORAL

## 2020-09-11 MED ORDER — SODIUM CHLORIDE 0.9 % IV SOLN: 250.0000 mL | INTRAVENOUS | Status: DC | PRN

## 2020-09-11 MED ORDER — CLOPIDOGREL BISULFATE 75 MG PO TABS: 75.0000 mg | ORAL_TABLET | Freq: Every day | ORAL | 4 refills | Status: DC

## 2020-09-11 MED ORDER — CLOPIDOGREL BISULFATE 75 MG PO TABS
ORAL_TABLET | ORAL | Status: AC
  Filled 2020-09-11: qty 2

## 2020-09-11 MED ORDER — MORPHINE SULFATE (PF) 4 MG/ML IV SOLN: 2.0000 mg | INTRAVENOUS | Status: DC | PRN

## 2020-09-11 MED ORDER — SODIUM CHLORIDE 0.9 % IV SOLN
INTRAVENOUS | Status: DC
  Administered 2020-09-11: 1000 mL via INTRAVENOUS

## 2020-09-11 MED ORDER — DIPHENHYDRAMINE HCL 50 MG/ML IJ SOLN: 50.0000 mg | Freq: Once | INTRAMUSCULAR | Status: DC | PRN

## 2020-09-11 MED ORDER — SODIUM CHLORIDE 0.9% FLUSH: 3.0000 mL | INTRAVENOUS | Status: DC | PRN

## 2020-09-11 MED ORDER — ACETAMINOPHEN 325 MG PO TABS
ORAL_TABLET | ORAL | Status: AC
  Filled 2020-09-11: qty 2

## 2020-09-11 MED ORDER — FAMOTIDINE 20 MG PO TABS: 40.0000 mg | ORAL_TABLET | Freq: Once | ORAL | Status: DC | PRN

## 2020-09-11 MED ORDER — SODIUM CHLORIDE 0.9 % IV SOLN: INTRAVENOUS | Status: DC

## 2020-09-11 MED ORDER — MORPHINE SULFATE (PF) 2 MG/ML IV SOLN
INTRAVENOUS | Status: AC
  Filled 2020-09-11: qty 1

## 2020-09-11 MED ORDER — CLINDAMYCIN PHOSPHATE 300 MG/50ML IV SOLN
300.0000 mg | Freq: Once | INTRAVENOUS | Status: AC
  Administered 2020-09-11: 300 mg via INTRAVENOUS

## 2020-09-11 MED ORDER — MIDAZOLAM HCL 5 MG/5ML IJ SOLN
INTRAMUSCULAR | Status: AC
  Filled 2020-09-11: qty 5

## 2020-09-11 MED ORDER — METHYLPREDNISOLONE SODIUM SUCC 125 MG IJ SOLR: 125.0000 mg | Freq: Once | INTRAMUSCULAR | Status: DC | PRN

## 2020-09-11 SURGICAL SUPPLY — 24 items
BALLN LUTONIX 018 4X40X130 (BALLOONS) ×2
BALLN LUTONIX DCB 7X60X130 (BALLOONS) ×2
BALLN ULTRASCORE 014 3X40X150 (BALLOONS) ×2
BALLOON LUTONIX 018 4X40X130 (BALLOONS) ×1 IMPLANT
BALLOON LUTONIX DCB 7X60X130 (BALLOONS) ×1 IMPLANT
BALLOON ULTRSCRE 014 3X40X150 (BALLOONS) ×1 IMPLANT
CANNULA 5F STIFF (CANNULA) ×4 IMPLANT
CATH ANGIO 5F PIGTAIL 65CM (CATHETERS) ×2 IMPLANT
CATH VERT 5FR 125CM (CATHETERS) ×2 IMPLANT
COVER PROBE U/S 5X48 (MISCELLANEOUS) ×4 IMPLANT
DEVICE STARCLOSE SE CLOSURE (Vascular Products) ×4 IMPLANT
GLIDEWIRE ADV .035X260CM (WIRE) ×2 IMPLANT
GUIDEWIRE VERSACORE 260 (WIRE) ×2 IMPLANT
INTRODUCER 7FR 23CM (INTRODUCER) ×4 IMPLANT
KIT ENCORE 26 ADVANTAGE (KITS) ×4 IMPLANT
PACK ANGIOGRAPHY (CUSTOM PROCEDURE TRAY) ×2 IMPLANT
SHEATH BRITE TIP 5FRX11 (SHEATH) ×4 IMPLANT
SHEATH RAABE 6FRX70 (SHEATH) ×2 IMPLANT
STENT LIFESTAR 10X60 (Permanent Stent) ×2 IMPLANT
STENT LIFESTREAM 9X58X80 (Permanent Stent) ×4 IMPLANT
TUBING CONTRAST HIGH PRESS 48 (TUBING) ×4 IMPLANT
WIRE AMPLATZ SSTIFF .035X260CM (WIRE) ×2 IMPLANT
WIRE GUIDERIGHT .035X150 (WIRE) ×2 IMPLANT
WIRE RUNTHROUGH .014X300CM (WIRE) ×2 IMPLANT

## 2020-09-11 NOTE — Op Note (Signed)
[facility]  Percutaneous Study/Intervention Procedural Note   Date of Surgery: 09/11/2020  Surgeon:Roiza Wiedel, Dolores Lory   Pre-operative Diagnosis: Atherosclerotic occlusive disease bilateral lower extremity with ulceration of the left foot  Post-operative diagnosis:  Same  Procedure(s) Performed:  1.  Abdominal aortogram  2.  Bilateral distal runoff  3.  Percutaneous transluminal angioplasty and stent placement right common iliac artery; "kissing balloon" technique  4.  Percutaneous transluminal and plasty and stent placement left common iliac artery; "kissing balloon" technique  5.  Ultrasound guided access bilateral common femoral arteries  6.  Percutaneous transluminal angioplasty left distal popliteal artery to 4 mm with Lutonix drug-eluting balloon.              7.  Percutaneous transluminal angioplasty and stent placement right external iliac artery with a life star stent postdilated to 7 mm with a Lutonix drug-eluting balloon.               8.  StarClose closure device bilateral common femoral arteries  Anesthesia: Conscious sedation was administered under my direct supervision by the interventional radiology RN. IV Versed plus fentanyl were utilized. Continuous ECG, pulse oximetry and blood pressure was monitored throughout the entire procedure. Conscious sedation was for a total of 1 hour 19 minutes 14 seconds.  Sheath: Left popliteal intervention performed through a 6 [place] Raby right common femoral retrograde; iliac stenting performed through 23 cm 7 French Pinnacle sheaths bilaterally  Contrast: 90 cc  Fluoroscopy Time: 8.5 minutes  Indications: Patient presented to the office with nonhealing wounds of the left foot.  Noninvasive studies as well as physical examination demonstrated significant atherosclerotic occlusive disease.  He is therefore undergoing angiography with hope for intervention for limb salvage.  Risk and benefits of been reviewed all  questions answered patient agrees to proceed  Procedure:  Karl Andersonis a 87 y.o. male who was identified and appropriate procedural time out was performed.  The patient was then placed supine on the table and prepped and draped in the usual sterile fashion.  Ultrasound was used to evaluate the right common femoral artery.  It was echolucent and pulsatile indicating it is patent .  An ultrasound image was acquired for the permanent record.  A micropuncture needle was used to access the right common femoral artery under direct ultrasound guidance.  The microwire was then advanced under fluoroscopic guidance without difficulty followed by the micro-sheath  A 0.035 J wire was advanced without resistance and a 5Fr sheath was placed.    The pigtail catheter was then positioned at the level of T12 and an AP image of the aorta was obtained. After review the images the pigtail catheter was repositioned above the aortic bifurcation and bilateral oblique views of the pelvis were obtained. Subsequently the detector was returned to the AP position and the pigtail catheter and advantage wire was used to cross the aortic bifurcation pigtail catheter was advanced down to the distal left external iliac and an LAO view of the femoral bifurcation is obtained.  The wire was then reintroduced and the pigtail catheter wire negotiated into the saphenous vein bypass.  Distal runoff of the left lower extremity is then performed through this third order catheter position.  Diagnostic interpretation: The abdominal aorta is opacified with a bolus ejection contrast.  Is diffusely diseased but there are no hemodynamically significant stenoses.  At the aortic bifurcation there is increasing disease with a greater than 80% stenosis at the origin of the left common  iliac.  This extends for several centimeters and then the distal left common iliac artery is widely patent.  Left iliac bifurcation is patent and the left external iliac  artery is diffusely diseased with a 30 to 40% focal stenosis in its distal portion but no other focal lesions are noted.  On the right there is diffuse disease throughout the common iliac with 3 focal greater than 70% stenoses.  The right iliac bifurcation is heavily diseased with greater than 80% stenosis at the origin of the right internal iliac artery and greater than 80% stenosis at the origin of the right external iliac artery extending for approximately 2 to 3 cm distally.  The distal half of the right external iliac artery is widely patent.  The left common femoral demonstrates moderate disease but there are no focal hemodynamically significant stenoses.  The profunda femoris is widely patent.  SFA occludes approximately 3 cm distal to its origin.  At this location there is the origin of a saphenous vein bypass which is widely patent.  Saphenous vein bypass is widely patent down to the below-knee anastomosis which is patent but just distal to the anastomosis in the popliteal there is a greater than 95% string sign.  There is single-vessel runoff via the peroneal which reconstitutes the distal posterior tibial at the ankle filling the plantar vessels and the pedal arch.  There is nonvisualization of the anterior tibial as well as the dorsalis pedis throughout its course.  5000 units of heparin was given and allowed to circulate for several minutes.  And an Amplatz wire was then introduced through the pigtail catheter in the catheter and 5 French sheath removed.  A 6 French Raby sheath was then advanced over the Amplatz wire across the bifurcation position with the tip in the mid saphenous vein bypass.  100 cm Kumpe catheter is then advanced over the Amplatz wire position with its tip just below the knee and hand-injection contrast used to highlight the string sign, greater than 95% stenosis, in the distal popliteal.  0.014 run-through wire was then advanced through the Kumpe catheter and the catheter  removed.  A 3 mm x 40 mm ultra score balloon was then advanced across this string sign inflated to 12 atm for 1 minute.  Next a 4 mm x 40 mm Lutonix drug-eluting balloon was advanced across this lesion inflation was to 10 atm for approximately 2 minutes.  Follow-up imaging demonstrated less than 5% residual stenosis in the distal popliteal with preservation of the peroneal runoff.  The Amplatz Super Stiff wire was then reintroduced and the sheath pulled back into the aorta.  The Rabie sheath was then removed and a 7 French 23 cm Pinnacle sheath inserted over the Amplatz wire.  After review the images the ultrasound was reprepped and delivered back onto the sterile field. The left common femoral was then imaged with the ultrasound it was noted to be echolucent and pulsatile indicating patency. Images recorded for the permanent record. Under real-time visualization a microneedle was inserted into the anterior wall the common femoral artery microwire was then advanced without difficulty under fluoroscopic guidance followed by placement of the micro-sheath.  A versa core wire was then negotiated under fluoroscopic guidance into the aorta.  7 French 23 cm Pinnacle sheath was then placed.  Magnified images of the aortic bifurcation were then made using hand injection contrast from the femoral sheaths. After appropriate sizing a 8 mm x 58 mm lifestream stent was selected for the right  and a 8 mm x 58 mm lifestream stent was selected for the left. There were then advanced and positioned just above the aortic bifurcation. Insufflation for full expansion of the stents was performed simultaneously. Follow-up imaging was then performed and the aortic reconstruction looked quite good with less than 10% residual stenosis however on the right the greater than 80% external iliac lesion still needed to be addressed.  I selected a 10 mm x 60 mm life star stent and deployed this into the external iliac overlapping the common  iliac lifestream stent by 1 cm.  The stent was then fully expanded using a 7 mm x 60 mm Lutonix drug-eluting balloon inflated to 12 atm for 1 minute.  Follow-up imaging demonstrated less than 10% residual stenosis throughout the entire right external iliac artery  The pigtail catheter was then introduced up the right and bolus injection of contrast was used to perform final imaging of the distal aortic reconstruction in both right and left oblique views.  Oblique views were then obtained of the groins in succession and Star close device is deployed without difficulty. There were no immediate complications   Findings:  The abdominal aorta is opacified with a bolus ejection contrast.  Is diffusely diseased but there are no hemodynamically significant stenoses.  At the aortic bifurcation there is increasing disease with a greater than 80% stenosis at the origin of the left common iliac.  This extends for several centimeters and then the distal left common iliac artery is widely patent.  Left iliac bifurcation is patent and the left external iliac artery is diffusely diseased with a 30 to 40% focal stenosis in its distal portion but no other focal lesions are noted.  On the right there is diffuse disease throughout the common iliac with 3 focal greater than 70% stenoses.  The right iliac bifurcation is heavily diseased with greater than 80% stenosis at the origin of the right internal iliac artery and greater than 80% stenosis at the origin of the right external iliac artery extending for approximately 2 to 3 cm distally.  The distal half of the right external iliac artery is widely patent.  The left common femoral demonstrates moderate disease but there are no focal hemodynamically significant stenoses.  The profunda femoris is widely patent.  SFA occludes approximately 3 cm distal to its origin.  At this location there is the origin of a saphenous vein bypass which is widely patent.  Saphenous vein bypass is  widely patent down to the below-knee anastomosis which is patent but just distal to the anastomosis in the popliteal there is a greater than 95% string sign.  There is single-vessel runoff via the peroneal which reconstitutes the distal posterior tibial at the ankle filling the plantar vessels and the pedal arch.  There is nonvisualization of the anterior tibial as well as the dorsalis pedis throughout its course.  The left distal popliteal artery is now widely patent after angioplasty to 4 mm with less than 5% residual stenosis.   Following placement of the bilateral common iliac stents there is now wide patency with less than 10% residual stenosis with rapid flow through the aortic bifurcation bilaterally.  Following placement of the right external iliac artery stent there is now less than 10% residual stenosis.  Summary:  Successful reconstruction of the distal aorta and bilateral iliac arteries as well as the left lower extremity.  Disposition: Patient was taken to the recovery room in stable condition having tolerated the procedure well.  Belenda Cruise Tylique Aull 09/11/2020,11:20 AM

## 2020-09-11 NOTE — Interval H&P Note (Signed)
History and Physical Interval Note:  09/11/2020 9:42 AM  Jesse Rivera  has presented today for surgery, with the diagnosis of LLE Angio    BARD   ASO w claudication  Covid April 29.  The various methods of treatment have been discussed with the patient and family. After consideration of risks, benefits and other options for treatment, the patient has consented to  Procedure(s): LOWER EXTREMITY ANGIOGRAPHY (Left) as a surgical intervention.  The patient's history has been reviewed, patient examined, no change in status, stable for surgery.  I have reviewed the patient's chart and labs.  Questions were answered to the patient's satisfaction.     Levora Dredge

## 2020-09-24 ENCOUNTER — Other Ambulatory Visit (INDEPENDENT_AMBULATORY_CARE_PROVIDER_SITE_OTHER): Payer: Self-pay | Admitting: Vascular Surgery

## 2020-09-24 ENCOUNTER — Encounter (INDEPENDENT_AMBULATORY_CARE_PROVIDER_SITE_OTHER): Payer: Medicare Other

## 2020-09-24 ENCOUNTER — Ambulatory Visit (INDEPENDENT_AMBULATORY_CARE_PROVIDER_SITE_OTHER): Payer: Medicare Other | Admitting: Nurse Practitioner

## 2020-09-24 DIAGNOSIS — Z95820 Peripheral vascular angioplasty status with implants and grafts: Secondary | ICD-10-CM

## 2020-09-24 DIAGNOSIS — I70249 Atherosclerosis of native arteries of left leg with ulceration of unspecified site: Secondary | ICD-10-CM

## 2020-09-25 ENCOUNTER — Other Ambulatory Visit: Payer: Self-pay

## 2020-09-25 ENCOUNTER — Ambulatory Visit (INDEPENDENT_AMBULATORY_CARE_PROVIDER_SITE_OTHER): Payer: Medicare Other | Admitting: Nurse Practitioner

## 2020-09-25 ENCOUNTER — Ambulatory Visit (INDEPENDENT_AMBULATORY_CARE_PROVIDER_SITE_OTHER): Payer: Medicare Other

## 2020-09-25 ENCOUNTER — Encounter (INDEPENDENT_AMBULATORY_CARE_PROVIDER_SITE_OTHER): Payer: Self-pay | Admitting: Nurse Practitioner

## 2020-09-25 VITALS — BP 115/64 | HR 69 | Resp 16 | Wt 160.0 lb

## 2020-09-25 DIAGNOSIS — E119 Type 2 diabetes mellitus without complications: Secondary | ICD-10-CM

## 2020-09-25 DIAGNOSIS — Z95820 Peripheral vascular angioplasty status with implants and grafts: Secondary | ICD-10-CM

## 2020-09-25 DIAGNOSIS — I70249 Atherosclerosis of native arteries of left leg with ulceration of unspecified site: Secondary | ICD-10-CM

## 2020-09-25 DIAGNOSIS — I10 Essential (primary) hypertension: Secondary | ICD-10-CM | POA: Diagnosis not present

## 2020-10-01 ENCOUNTER — Encounter (INDEPENDENT_AMBULATORY_CARE_PROVIDER_SITE_OTHER): Payer: Self-pay | Admitting: Nurse Practitioner

## 2020-10-01 NOTE — Progress Notes (Signed)
Subjective:    Patient ID: Jesse Rivera, male    DOB: 12-11-1933, 87 y.o.   MRN: [id number] Chief Complaint  Patient presents with  . Follow-up    [facility] 2wk post le angio    The patient returns to the office for followup and review status post angiogram with intervention. The patient notes improvement in the lower extremity symptoms. No interval shortening of the patient's claudication distance or rest pain symptoms. Previous wounds have not healed but are slowly progressing.  No new ulcers or wounds have occurred since the last visit.  Intervention including: Procedure(s) Performed:             1.  Abdominal aortogram             2.  Bilateral distal runoff             3.  Percutaneous transluminal angioplasty and stent placement right common iliac artery; "kissing balloon" technique             4.  Percutaneous transluminal and plasty and stent placement left common iliac artery; "kissing balloon" technique             5.  Ultrasound guided access bilateral common femoral arteries             6.  Percutaneous transluminal angioplasty left distal popliteal artery to 4 mm with Lutonix drug-eluting balloon.              7.  Percutaneous transluminal angioplasty and stent placement right external iliac artery with a life star stent postdilated to 7 mm with a Lutonix drug-eluting balloon.               8.  StarClose closure device bilateral common femoral arteries   There have been no significant changes to the patient's overall health care.  The patient denies amaurosis fugax or recent TIA symptoms. There are no recent neurological changes noted. The patient denies history of DVT, PE or superficial thrombophlebitis. The patient denies recent episodes of angina or shortness of breath.   ABI's Rt= Jesse Rivera and Lt= Jesse Rivera (previous ABI's Rt= Jesse Rivera and Lt= Jesse Rivera) the patient has a TBI 0.47 on the right this is improved from previous TBI 0.40.  The left has a TBI 0.41 improved from 0.18. Duplex US of the  right lower extremity shows dampened monophasic/monophasic waveforms in the tibial arteries with slightly dampened toe waveforms.  The left lower extremity has monophasic tibial artery waveforms with slightly dampened toe waveforms.   Review of Systems     Objective:   Physical Exam Vitals reviewed.  HENT:     Head: Normocephalic.  Cardiovascular:     Rate and Rhythm: Normal rate.     Pulses:          Dorsalis pedis pulses are detected w/ Doppler on the right side and detected w/ Doppler on the left side.       Posterior tibial pulses are detected w/ Doppler on the right side and detected w/ Doppler on the left side.  Pulmonary:     Effort: Pulmonary effort is normal.  Neurological:     Mental Status: He is alert and oriented to person, place, and time.     Motor: Weakness present.     Gait: Gait abnormal.  Psychiatric:        Mood and Affect: Mood normal.        Behavior: Behavior normal.        Thought Content:  Thought content normal.        Judgment: Judgment normal.     BP 115/64 (BP Location: Right Arm)   Pulse 69   Resp 16   Wt 160 lb (72.6 kg)   BMI 23.63 kg/m   Past Medical History:  Diagnosis Date  . Arthritis   . Chronic kidney disease    Stage 3  . COPD (chronic obstructive pulmonary disease) (HCC)   . Coronary artery disease   . Diabetes mellitus without complication (HCC)   . Dysrhythmia    Atrial Fibrillation  . GERD (gastroesophageal reflux disease)   . Gouty arthropathy, chronic, without tophi   . Hyperlipidemia   . Hypertension   . Peripheral vascular disease (HCC)    Post bypass 1998  . Sleep apnea     Social History   Socioeconomic History  . Marital status: Married    Spouse name: Not on file  . Number of children: Not on file  . Years of education: Not on file  . Highest education level: Not on file  Occupational History  . Not on file  Tobacco Use  . Smoking status: Former Smoker    Packs/day: 1.50    Years: 48.00    Pack  years: 72.00    Types: Cigarettes    Quit date: 05/12/1996    Years since quitting: 24.4  . Smokeless tobacco: Never Used  Vaping Use  . Vaping Use: Never used  Substance and Sexual Activity  . Alcohol use: Not Currently  . Drug use: Never  . Sexual activity: Not on file  Other Topics Concern  . Not on file  Social History Narrative  . Not on file   Social Determinants of Health   Financial Resource Strain: Not on file  Food Insecurity: Not on file  Transportation Needs: Not on file  Physical Activity: Not on file  Stress: Not on file  Social Connections: Not on file  Intimate Partner Violence: Not on file    Past Surgical History:  Procedure Laterality Date  . APPENDECTOMY    . CHOLECYSTECTOMY    . CORONARY ARTERY BYPASS GRAFT     01/12/18  . FOOT SURGERY Bilateral   . heart surgery'     triple bypass  . LEG SURGERY Left    bypass surgery  . LOWER EXTREMITY ANGIOGRAPHY Left 09/11/2020   Procedure: LOWER EXTREMITY ANGIOGRAPHY;  Surgeon: Renford Dills, MD;  Location: [facility]IVE [facility];  Service: Cardiovascular;  Laterality: Left;  . PACEMAKER PLACEMENT    . SHOULDER SURGERY Left     Family History  Problem Relation Age of Onset  . Stroke Mother   . Heart attack Father     Allergies  Allergen Reactions  . Cefaclor Anaphylaxis, Rash and Swelling  . Erythromycin Base Rash and Swelling  . Sulfa Antibiotics Itching, Rash and Swelling  . Other Other (See Comments)    STERISTRIPS GLUE = [Rash], blisters, but tolerates paper tape NICOTINE PATCH GLUE = swelling Dental Bone Graft=itching, swelling, could not tolerate   . Codeine Itching and Rash    No flowsheet data found.    CMP     Component Value Date/Time   BUN 36 (H) 09/11/2020 0848   CREATININE 1.79 (H) 09/11/2020 0848   GFRNONAA 37 (L) 09/11/2020 0848     VAS [place] ABI WITH/WO TBI  Result Date: 09/27/2020  LOWER EXTREMITY DOPPLER STUDY Patient Name:  Jesse Rivera  Date of Exam:   09/25/2020  Medical Rec #:  [id number]       Accession #:    [id number] Date of Birth: 1933/10/24       Patient Gender: M Patient Age:   38Y Exam Location:  [facility] Procedure:      VAS [place] ABI WITH/WO TBI Referring Phys: [id number] Jesse Rivera --------------------------------------------------------------------------------  Indications: Rest pain, and ulceration.  Vascular Interventions: 09/11/2020 PTA and stent RCIA. LCIA, Lt dist popliteal and                         Rt EIA. Comparison Study: 08/23/2020 Performing Technologist: Reece Agar RT (R)(VS)  Examination Guidelines: A complete evaluation includes at minimum, Doppler waveform signals and systolic blood pressure reading at the level of bilateral brachial, anterior tibial, and posterior tibial arteries, when vessel segments are accessible. Bilateral testing is considered an integral part of a complete examination. Photoelectric Plethysmograph (PPG) waveforms and toe systolic pressure readings are included as required and additional duplex testing as needed. Limited examinations for reoccurring indications may be performed as noted.  ABI Findings: +---------+------------------+-----+-------------------+--------+ Right    Rt Pressure (mmHg)IndexWaveform           Comment  +---------+------------------+-----+-------------------+--------+ Brachial 122                                                +---------+------------------+-----+-------------------+--------+ ATA                             dampened monophasicNC       +---------+------------------+-----+-------------------+--------+ PTA                             monophasic         [place]       +---------+------------------+-----+-------------------+--------+ Great Toe57                0.47 Abnormal                    +---------+------------------+-----+-------------------+--------+ +---------+------------------+-----+----------+-------+ Left     Lt Pressure  (mmHg)IndexWaveform  Comment +---------+------------------+-----+----------+-------+ Brachial 117                                      +---------+------------------+-----+----------+-------+ ATA                             monophasicNC      +---------+------------------+-----+----------+-------+ PTA                             monophasicNC      +---------+------------------+-----+----------+-------+ Great Toe50                0.41 Abnormal          +---------+------------------+-----+----------+-------+ +-------+-----------+-----------+------------+------------+ ABI/TBIToday's ABIToday's TBIPrevious ABIPrevious TBI +-------+-----------+-----------+------------+------------+ Right  [place]         .47        [place]          .40          +-------+-----------+-----------+------------+------------+ Left   [place]         .41  [place]          .18          +-------+-----------+-----------+------------+------------+ Bilateral ABIs appear essentially unchanged compared to prior study on 08/23/2020. Bilateral TBIs appear essentially unchanged compared to prior study on 08/23/2020.  Summary: Right: Resting right ankle-brachial index indicates noncompressible right lower extremity arteries. The right toe-brachial index is abnormal. Left: Resting left ankle-brachial index indicates noncompressible left lower extremity arteries. The left toe-brachial index is abnormal. *See table(s) above for measurements and observations.  Electronically signed by Levora Dredge MD on 09/27/2020 at 12:35:53 PM.    Final    VAS [place] ABI WITH/WO TBI  Result Date: 08/23/2020 LOWER EXTREMITY DOPPLER STUDY Indications: Rest pain.  Comparison Study: 04/29/2018 Performing Technologist: Debbe Bales RVS  Examination Guidelines: A complete evaluation includes at minimum, Doppler waveform signals and systolic blood pressure reading at the level of bilateral brachial, anterior tibial, and posterior tibial arteries,  when vessel segments are accessible. Bilateral testing is considered an integral part of a complete examination. Photoelectric Plethysmograph (PPG) waveforms and toe systolic pressure readings are included as required and additional duplex testing as needed. Limited examinations for reoccurring indications may be performed as noted.  ABI Findings: +---------+------------------+-----+----------+--------+ Right    Rt Pressure (mmHg)IndexWaveform  Comment  +---------+------------------+-----+----------+--------+ Brachial 151                                       +---------+------------------+-----+----------+--------+ ATA      239               1.58 monophasicNC       +---------+------------------+-----+----------+--------+ PTA      250               1.66 monophasicNC       +---------+------------------+-----+----------+--------+ Great Toe61                0.40 Abnormal           +---------+------------------+-----+----------+--------+ +---------+------------------+-----+--------+-------+ Left     Lt Pressure (mmHg)IndexWaveformComment +---------+------------------+-----+--------+-------+ Brachial 148                                    +---------+------------------+-----+--------+-------+ ATA      190               1.26         [place]      +---------+------------------+-----+--------+-------+ PTA      164               1.09         [place]      +---------+------------------+-----+--------+-------+ Great Toe27                0.18 Abnormal        +---------+------------------+-----+--------+-------+ +-------+-----------+-----------+------------+------------+ ABI/TBIToday's ABIToday's TBIPrevious ABIPrevious TBI +-------+-----------+-----------+------------+------------+ Right  >1.0 [place]    .40        >1.0 [place]                  +-------+-----------+-----------+------------+------------+ Left   >1.0 [place]    .18        >1.0 [place]                   +-------+-----------+-----------+------------+------------+ Bilateral ABIs appear essentially unchanged compared to prior study on 04/29/2018.  Summary: Right: Resting right ankle-brachial index indicates noncompressible right  lower extremity arteries. The right toe-brachial index is abnormal. ABIs are unreliable. Left: Resting left ankle-brachial index indicates noncompressible left lower extremity arteries. The left toe-brachial index is abnormal. ABIs are unreliable.  *See table(s) above for measurements and observations.  Electronically signed by Levora DredgeGregory Schnier MD on 08/23/2020 at 4:52:14 PM.    Final        Assessment & Plan:   1. 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  Based on the TBI's the patient may have marginal ability to heal.  We will allow some time for wound healing have the patient return in 4 weeks for noninvasive studies to better reevaluate the patient's perfusion. - VAS US LOWER EXTREMITY ARTERIAL DUPLEX; Future  2. Essential hypertension Continue antihypertensive medications as already ordered, these medications have been reviewed and there are no changes at this time.   3. Type 2 diabetes mellitus without complication, unspecified whether long term insulin use (HCC) Continue hypoglycemic medications as already ordered, these medications have been reviewed and there are no changes at this time.  Hgb A1C to be monitored as already arranged by primary service    Current Outpatient Medications on File Prior to Visit  Medication Sig Dispense Refill  . albuterol (ACCUNEB) 1.25 MG/3ML nebulizer solution Inhale 1 ampule into the lungs 4 (four) times daily.    Marland Kitchen. albuterol (VENTOLIN HFA) 108 (90 Base) MCG/ACT inhaler Inhale 2 puffs into the lungs every 6 (six) hours as needed for wheezing or shortness of  breath.    . allopurinol (ZYLOPRIM) 100 MG tablet Take 100 mg by mouth daily.    Marland Kitchen. aspirin EC 81 MG tablet Take 81 mg by mouth daily.    Marland Kitchen. atorvastatin (LIPITOR) 10 MG tablet Take 10 mg by mouth daily.  0  . BD PEN NEEDLE NANO U/F 32G X 4 MM MISC U TO INJECT INSULIN D UTD  3  . budesonide (PULMICORT) 0.5 MG/2ML nebulizer solution Take 0.5 mg by nebulization daily.    . budesonide-formoterol (SYMBICORT) 160-4.5 MCG/ACT inhaler Inhale 2 puffs into the lungs 2 (two) times daily.    . Cholecalciferol 25 MCG (1000 UT) tablet Take 1,000 Units by mouth daily.    . clobetasol ointment (TEMOVATE) 0.05 % Apply to psoriasis areas on the body 1-2 times daily. Never apply to face.    . clopidogrel (PLAVIX) 75 MG tablet Take 1 tablet (75 mg total) by mouth daily. 30 tablet 4  . COMBIVENT RESPIMAT 20-100 MCG/ACT AERS respimat Inhale 1 puff into the lungs 4 (four) times daily.    . cyanocobalamin 100 MCG tablet Take by mouth.    . cyclobenzaprine (FLEXERIL) 5 MG tablet Take by mouth.    . Cysteamine Bitartrate (PROCYSBI) 300 MG PACK 3 (three) times daily To check blood sugar. diagnosis code: E11.42   Contour Next    . diclofenac sodium (VOLTAREN) 1 % GEL APPLY A SMALL AMOUNT(2 GRAMS) TO THE AFFECTED AREA EVERY 12 HOURS AS NEEDED. 25 day supply    . doxycycline (VIBRAMYCIN) 25 MG/5ML SUSR SMARTSIG:Milliliter(s) By Mouth    . DULoxetine (CYMBALTA) 30 MG capsule Take by mouth.    . DULoxetine (CYMBALTA) 30 MG capsule Take 30 mg by mouth daily.    . fenofibrate (TRICOR) 48 MG tablet TK 1 T PO QD  3  . Fluocinolone Acetonide Scalp 0.01 % OIL Apply to the  ears daily as needed for itching.    . fluticasone (FLONASE) 50 MCG/ACT nasal spray 2 sprays once daily    . furosemide (LASIX) 40 MG tablet 1 tablet once daily    . glucose blood (PRECISION QID TEST) test strip Use 1 each (1 strip total) 3 (three) times daily To check blood sugar. diagnosis code: E11.42  Contour Next    . Guaifenesin 1200 MG TB12 Take by mouth.     . hydrocortisone 2.5 % cream Apply to face 1-2 times daily    . hydrOXYzine (ATARAX/VISTARIL) 25 MG tablet TAKE 1 TABLET(25 MG) BY MOUTH TWICE DAILY AS NEEDED    . hydrOXYzine (ATARAX/VISTARIL) 25 MG tablet Take 25 mg by mouth 2 (two) times daily.    . insulin glargine (LANTUS) 100 UNIT/ML Solostar Pen Inject into the skin.    . Ipratropium-Albuterol (COMBIVENT RESPIMAT) 20-100 MCG/ACT AERS respimat INHALE 2 INHALATIONS INTO THE LUNGS FOUR TIMES DAILY    . isosorbide mononitrate (IMDUR) 30 MG 24 hr tablet 60 mg.   3  . ketoconazole (NIZORAL) 2 % shampoo     . LANTUS SOLOSTAR 100 UNIT/ML Solostar Pen ADM 52 UNI [place] D  3  . magnesium oxide (MAG-OX) 400 MG tablet   1  . metolazone (ZAROXOLYN) 2.5 MG tablet TAKE 1 TABLET(2.5 MG) BY MOUTH EVERY DAY    . metolazone (ZAROXOLYN) 2.5 MG tablet Take 2.5 mg by mouth daily.    . metoprolol succinate (TOPROL-XL) 25 MG 24 hr tablet TK 1 T PO D  11  . montelukast (SINGULAIR) 10 MG tablet   3  . Nebulizers (VIOS LC SPRINT DELUXE) MISC USE TO ADMINISTER NEBULIZER SOLUTIONS    . nitroGLYCERIN (NITROSTAT) 0.4 MG SL tablet DISSOLVE 1 T UNDER TONGUE Q 5 MINUTES UP TO 3 DOSES    . omeprazole (PRILOSEC) 40 MG capsule 1 capsule 2 (two) times daily    . predniSONE (DELTASONE) 10 MG tablet Take 10 mg by mouth daily.    . predniSONE (DELTASONE) 20 MG tablet Take 20 mg by mouth daily.    . tamsulosin (FLOMAX) 0.4 MG CAPS capsule TK 2 CS PO D  3  . TIKOSYN 250 MCG capsule TK 1 C PO BID  5  . tiotropium (SPIRIVA) 18 MCG inhalation capsule Place into inhaler and inhale.    . torsemide (DEMADEX) 20 MG tablet Take by mouth.    . torsemide (DEMADEX) 20 MG tablet Take 20 mg by mouth daily.    . VENTOLIN HFA 108 (90 Base) MCG/ACT inhaler SMARTSIG:2 Inhalation Via Inhaler 4 Times Daily    . warfarin (COUMADIN) 2.5 MG tablet Take by mouth.    . doxycycline (VIBRA-TABS) 100 MG tablet Take 100 mg by mouth 2 (two) times daily. (Patient not taking: No sig reported)     No current  facility-administered medications on file prior to visit.    There are no Patient Instructions on file for this visit. No follow-ups on file.   Georgiana Spinner, NP

## 2020-10-26 ENCOUNTER — Other Ambulatory Visit: Payer: Self-pay

## 2020-10-26 ENCOUNTER — Ambulatory Visit (INDEPENDENT_AMBULATORY_CARE_PROVIDER_SITE_OTHER): Payer: Medicare Other | Admitting: Nurse Practitioner

## 2020-10-26 ENCOUNTER — Ambulatory Visit (INDEPENDENT_AMBULATORY_CARE_PROVIDER_SITE_OTHER): Payer: Medicare Other

## 2020-10-26 VITALS — BP 115/63 | HR 73 | Resp 16

## 2020-10-26 DIAGNOSIS — I70222 Atherosclerosis of native arteries of extremities with rest pain, left leg: Secondary | ICD-10-CM | POA: Diagnosis not present

## 2020-10-26 DIAGNOSIS — I10 Essential (primary) hypertension: Secondary | ICD-10-CM

## 2020-10-26 DIAGNOSIS — I890 Lymphedema, not elsewhere classified: Secondary | ICD-10-CM | POA: Diagnosis not present

## 2020-10-26 DIAGNOSIS — I70249 Atherosclerosis of native arteries of left leg with ulceration of unspecified site: Secondary | ICD-10-CM | POA: Diagnosis not present

## 2020-10-27 ENCOUNTER — Encounter (INDEPENDENT_AMBULATORY_CARE_PROVIDER_SITE_OTHER): Payer: Self-pay | Admitting: Nurse Practitioner

## 2020-10-27 NOTE — Progress Notes (Signed)
Subjective:    Patient ID: Jesse Rivera, male    DOB: 06-21-1933, 87 y.o.   MRN: [id number] Chief Complaint  Patient presents with   Follow-up    Ultrasound follow up    The patient returns to the office for followup and review of the noninvasive studies. There have been no interval changes in lower extremity symptoms. No interval shortening of the patient's claudication distance or development of rest pain symptoms.  His previous wounds are healed.  No new ulcers or wounds have occurred since the last visit.  The patient notes that he does continue to have some neuropathy however it is greatly reduced following his most recent angiogram.  The patient swelling is also decreased significantly.  There have been no significant changes to the patient's overall health care.  The patient denies amaurosis fugax or recent TIA symptoms. There are no recent neurological changes noted. The patient denies history of DVT, PE or superficial thrombophlebitis. The patient denies recent episodes of angina or shortness of breath.   Lower extremity arterial duplex of the right lower extremity shows biphasic/triphasic waveforms down to the level of the distal popliteal.  The distal anterior tibial artery is absent but with monophasic waveforms in the posterior tibial and peroneal distal areas.  The left lower extremity has a bypass graft with triphasic waveforms in the femoral artery with occlusion from the proximal to distal SFA.  The bypass graft is open and patent.  The tibial vessels have monophasic waveforms at the distal portions.   Review of Systems  Skin:  Negative for wound.  Neurological:  Positive for weakness and numbness.  All other systems reviewed and are negative.     Objective:   Physical Exam Vitals reviewed.  HENT:     Head: Normocephalic.  Cardiovascular:     Rate and Rhythm: Normal rate.     Pulses: Decreased pulses.  Pulmonary:     Effort: Pulmonary effort is normal.   Neurological:     Mental Status: He is alert and oriented to person, place, and time.     Motor: Weakness present.     Gait: Gait abnormal.  Psychiatric:        Mood and Affect: Mood normal.        Behavior: Behavior normal.        Thought Content: Thought content normal.        Judgment: Judgment normal.    BP 115/63 (BP Location: Right Arm)   Pulse 73   Resp 16   Past Medical History:  Diagnosis Date   Arthritis    Chronic kidney disease    Stage 3   COPD (chronic obstructive pulmonary disease) (HCC)    Coronary artery disease    Diabetes mellitus without complication (HCC)    Dysrhythmia    Atrial Fibrillation   GERD (gastroesophageal reflux disease)    Gouty arthropathy, chronic, without tophi    Hyperlipidemia    Hypertension    Peripheral vascular disease (HCC)    Post bypass 1998   Sleep apnea     Social History   Socioeconomic History   Marital status: Married    Spouse name: Not on file   Number of children: Not on file   Years of education: Not on file   Highest education level: Not on file  Occupational History   Not on file  Tobacco Use   Smoking status: Former    Packs/day: 1.50    Years: 48.00  Pack years: 72.00    Types: Cigarettes    Quit date: 05/12/1996    Years since quitting: 24.4   Smokeless tobacco: Never  Vaping Use   Vaping Use: Never used  Substance and Sexual Activity   Alcohol use: Not Currently   Drug use: Never   Sexual activity: Not on file  Other Topics Concern   Not on file  Social History Narrative   Not on file   Social Determinants of Health   Financial Resource Strain: Not on file  Food Insecurity: Not on file  Transportation Needs: Not on file  Physical Activity: Not on file  Stress: Not on file  Social Connections: Not on file  Intimate Partner Violence: Not on file    Past Surgical History:  Procedure Laterality Date   APPENDECTOMY     CHOLECYSTECTOMY     CORONARY ARTERY BYPASS GRAFT     01/12/18    FOOT SURGERY Bilateral    heart surgery'     triple bypass   LEG SURGERY Left    bypass surgery   LOWER EXTREMITY ANGIOGRAPHY Left 09/11/2020   Procedure: LOWER EXTREMITY ANGIOGRAPHY;  Surgeon: Renford Dills, MD;  Location: [facility];  Service: Cardiovascular;  Laterality: Left;   PACEMAKER PLACEMENT     SHOULDER SURGERY Left     Family History  Problem Relation Age of Onset   Stroke Mother    Heart attack Father     Allergies  Allergen Reactions   Cefaclor Anaphylaxis, Rash and Swelling   Erythromycin Base Rash and Swelling   Sulfa Antibiotics Itching, Rash and Swelling   Other Other (See Comments)    STERISTRIPS GLUE = [Rash], blisters, but tolerates paper tape NICOTINE PATCH GLUE = swelling Dental Bone Graft=itching, swelling, could not tolerate    Codeine Itching and Rash    No flowsheet data found.    CMP     Component Value Date/Time   BUN 36 (H) 09/11/2020 0848   CREATININE 1.79 (H) 09/11/2020 0848   GFRNONAA 37 (L) 09/11/2020 0848     VAS [place] ABI WITH/WO TBI  Result Date: 09/27/2020  LOWER EXTREMITY DOPPLER STUDY Patient Name:  Jesse Rivera  Date of Exam:   09/25/2020 Medical Rec #: [id number]       Accession #:    [id number] Date of Birth: 31-Dec-1933       Patient Gender: M Patient Age:   49Y Exam Location:  [facility] Procedure:      VAS [place] ABI WITH/WO TBI Referring Phys: [id number] Latina Craver SCHNIER --------------------------------------------------------------------------------  Indications: Rest pain, and ulceration.  Vascular Interventions: 09/11/2020 PTA and stent RCIA. LCIA, Lt dist popliteal and                         Rt EIA. Comparison Study: 08/23/2020 Performing Technologist: Reece Agar RT (R)(VS)  Examination Guidelines: A complete evaluation includes at minimum, Doppler waveform signals and systolic blood pressure reading at the level of bilateral brachial, anterior tibial, and posterior tibial arteries, when vessel segments  are accessible. Bilateral testing is considered an integral part of a complete examination. Photoelectric Plethysmograph (PPG) waveforms and toe systolic pressure readings are included as required and additional duplex testing as needed. Limited examinations for reoccurring indications may be performed as noted.  ABI Findings: +---------+------------------+-----+-------------------+--------+ Right    Rt Pressure (mmHg)IndexWaveform           Comment  +---------+------------------+-----+-------------------+--------+ Brachial 122                                                +---------+------------------+-----+-------------------+--------+  ATA                             dampened monophasicNC       +---------+------------------+-----+-------------------+--------+ PTA                             monophasic         [place]       +---------+------------------+-----+-------------------+--------+ Great Toe57                0.47 Abnormal                    +---------+------------------+-----+-------------------+--------+ +---------+------------------+-----+----------+-------+ Left     Lt Pressure (mmHg)IndexWaveform  Comment +---------+------------------+-----+----------+-------+ Brachial 117                                      +---------+------------------+-----+----------+-------+ ATA                             monophasicNC      +---------+------------------+-----+----------+-------+ PTA                             monophasicNC      +---------+------------------+-----+----------+-------+ Great Toe50                0.41 Abnormal          +---------+------------------+-----+----------+-------+ +-------+-----------+-----------+------------+------------+ ABI/TBIToday's ABIToday's TBIPrevious ABIPrevious TBI +-------+-----------+-----------+------------+------------+ Right  [place]         .47        [place]          .40           +-------+-----------+-----------+------------+------------+ Left   [place]         .41        [place]          .18          +-------+-----------+-----------+------------+------------+ Bilateral ABIs appear essentially unchanged compared to prior study on 08/23/2020. Bilateral TBIs appear essentially unchanged compared to prior study on 08/23/2020.  Summary: Right: Resting right ankle-brachial index indicates noncompressible right lower extremity arteries. The right toe-brachial index is abnormal. Left: Resting left ankle-brachial index indicates noncompressible left lower extremity arteries. The left toe-brachial index is abnormal. *See table(s) above for measurements and observations.  Electronically signed by Levora Dredge MD on 09/27/2020 at 12:35:53 PM.    Final        Assessment & Plan:   1. 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  The patient does not voice lifestyle limiting changes at this point in time.  Noninvasive studies do not suggest clinically significant change.  No invasive studies, angiography or surgery at this time The patient should continue walking and begin a more formal exercise program.  The patient should continue antiplatelet therapy and aggressive treatment of the lipid abnormalities  No changes in the patient's medications at this time  The patient should continue wearing graduated compression socks 10-15 mmHg strength to control the mild edema.    2. Lymphedema Is much improved today.  The patient is advised to continue with use of medical grade compression and elevation to help with control  of edema.  3. Essential hypertension Continue antihypertensive medications as already ordered, these medications have been reviewed and there are no changes at this time.    Current Outpatient Medications on File Prior to Visit  Medication  Sig Dispense Refill   albuterol (ACCUNEB) 1.25 MG/3ML nebulizer solution Inhale 1 ampule into the lungs 4 (four) times daily.     albuterol (VENTOLIN HFA) 108 (90 Base) MCG/ACT inhaler Inhale 2 puffs into the lungs every 6 (six) hours as needed for wheezing or shortness of breath.     allopurinol (ZYLOPRIM) 100 MG tablet Take 100 mg by mouth daily.     aspirin EC 81 MG tablet Take 81 mg by mouth daily.     atorvastatin (LIPITOR) 10 MG tablet Take 10 mg by mouth daily.  0   BD PEN NEEDLE NANO U/F 32G X 4 MM MISC U TO INJECT INSULIN D UTD  3   budesonide (PULMICORT) 0.5 MG/2ML nebulizer solution Take 0.5 mg by nebulization daily.     budesonide-formoterol (SYMBICORT) 160-4.5 MCG/ACT inhaler Inhale 2 puffs into the lungs 2 (two) times daily.     Cholecalciferol 25 MCG (1000 UT) tablet Take 1,000 Units by mouth daily.     clobetasol ointment (TEMOVATE) 0.05 % Apply to psoriasis areas on the body 1-2 times daily. Never apply to face.     clopidogrel (PLAVIX) 75 MG tablet Take 1 tablet (75 mg total) by mouth daily. 30 tablet 4   COMBIVENT RESPIMAT 20-100 MCG/ACT AERS respimat Inhale 1 puff into the lungs 4 (four) times daily.     cyanocobalamin 100 MCG tablet Take by mouth.     cyclobenzaprine (FLEXERIL) 5 MG tablet Take by mouth.     Cysteamine Bitartrate (PROCYSBI) 300 MG PACK 3 (three) times daily To check blood sugar. diagnosis code: E11.42   Contour Next     diclofenac sodium (VOLTAREN) 1 % GEL APPLY A SMALL AMOUNT(2 GRAMS) TO THE AFFECTED AREA EVERY 12 HOURS AS NEEDED. 25 day supply     doxycycline (VIBRAMYCIN) 25 MG/5ML SUSR SMARTSIG:Milliliter(s) By Mouth     DULoxetine (CYMBALTA) 30 MG capsule Take by mouth.     DULoxetine (CYMBALTA) 30 MG capsule Take 30 mg by mouth daily.     fenofibrate (TRICOR) 48 MG tablet TK 1 T PO QD  3   Fluocinolone Acetonide Scalp 0.01 % OIL Apply to the ears daily as needed for itching.     fluticasone (FLONASE) 50 MCG/ACT nasal spray 2 sprays once daily      furosemide (LASIX) 40 MG tablet 1 tablet once daily     glucose blood (PRECISION QID TEST) test strip Use 1 each (1 strip total) 3 (three) times daily To check blood sugar. diagnosis code: E11.42  Contour Next     Guaifenesin 1200 MG TB12 Take by mouth.     hydrocortisone 2.5 % cream Apply to face 1-2 times daily     hydrOXYzine (ATARAX/VISTARIL) 25 MG tablet TAKE 1 TABLET(25 MG) BY MOUTH TWICE DAILY AS NEEDED     hydrOXYzine (ATARAX/VISTARIL) 25 MG tablet Take 25 mg by mouth 2 (two) times daily.     insulin glargine (LANTUS) 100 UNIT/ML Solostar Pen Inject into the skin.     Ipratropium-Albuterol (COMBIVENT RESPIMAT) 20-100 MCG/ACT AERS respimat INHALE 2 INHALATIONS INTO THE LUNGS FOUR TIMES DAILY     isosorbide mononitrate (IMDUR) 30 MG 24 hr tablet 60 mg.   3   ketoconazole (NIZORAL) 2 % shampoo  LANTUS SOLOSTAR 100 UNIT/ML Solostar Pen ADM 52 UNI [place] D  3   magnesium oxide (MAG-OX) 400 MG tablet   1   metolazone (ZAROXOLYN) 2.5 MG tablet TAKE 1 TABLET(2.5 MG) BY MOUTH EVERY DAY     metolazone (ZAROXOLYN) 2.5 MG tablet Take 2.5 mg by mouth daily.     metoprolol succinate (TOPROL-XL) 25 MG 24 hr tablet TK 1 T PO D  11   montelukast (SINGULAIR) 10 MG tablet   3   Nebulizers (VIOS LC SPRINT DELUXE) MISC USE TO ADMINISTER NEBULIZER SOLUTIONS     nitroGLYCERIN (NITROSTAT) 0.4 MG SL tablet DISSOLVE 1 T UNDER TONGUE Q 5 MINUTES UP TO 3 DOSES     omeprazole (PRILOSEC) 40 MG capsule 1 capsule 2 (two) times daily     predniSONE (DELTASONE) 10 MG tablet Take 10 mg by mouth daily.     predniSONE (DELTASONE) 20 MG tablet Take 20 mg by mouth daily.     tamsulosin (FLOMAX) 0.4 MG CAPS capsule TK 2 CS PO D  3   TIKOSYN 250 MCG capsule TK 1 C PO BID  5   tiotropium (SPIRIVA) 18 MCG inhalation capsule Place into inhaler and inhale.     torsemide (DEMADEX) 20 MG tablet Take by mouth.     torsemide (DEMADEX) 20 MG tablet Take 20 mg by mouth daily.     VENTOLIN HFA 108 (90 Base) MCG/ACT inhaler SMARTSIG:2  Inhalation Via Inhaler 4 Times Daily     warfarin (COUMADIN) 2.5 MG tablet Take by mouth.     doxycycline (VIBRA-TABS) 100 MG tablet Take 100 mg by mouth 2 (two) times daily. (Patient not taking: No sig reported)     No current facility-administered medications on file prior to visit.    There are no Patient Instructions on file for this visit. No follow-ups on file.   Georgiana SpinnerFallon E Kemar Pandit, NP

## 2020-12-20 ENCOUNTER — Telehealth (HOSPITAL_COMMUNITY): Payer: Self-pay | Admitting: *Deleted

## 2020-12-20 NOTE — Telephone Encounter (Signed)
Received referral from Dr. Karna Christmas [facility] for this pt to participate in pulmonary rehab with the diagnosis of Copd Stage 3.  Called and spoke to pt and his wife regarding this referral.  Pt spoke weakly could barely understand what he was saying.  Asked him if he was doing any physical therapy he indicated that he was not.  I do see a referral for physical therapy not pulmonary rehab electronically although we received via fax a referral for pulmonary rehab. In talking with this pt he can barely ambulate 10 feet without giving out. Rarely comes out of the home other than going to doctor appt. Groceries are delivered to the home.  Pt has to have help with bathing unable to perform ADL independently. Based upon his limited mobility, I do not feel he is appropriate for a independent group exercise program.  Feel he would benefit from home physical therapy as it is very difficult for him to get out and about. Asked to speak with his wife so I could relay this information to her.  Pt wife agreed that he would not be able to do group exercise and needs to increase his strength, stamina and endurance first.  Will let Dr. Karna Christmas office know and ask for home physical therapy.  Mailed brochure to address listed with contact information. Alanson Aly, BSN Cardiac and Emergency planning/management officer

## 2020-12-20 NOTE — Telephone Encounter (Signed)
Received return call from Dr. Karna Christmas nurse.  Advisement that this pt is not appropriate for pulmonary rehab at this time.  Felt he would benefit from home PT. Dr. Karna Christmas nurse commented that Gates Rigg was referred to home PT before and he did not participate so therefore Dr. Karna Christmas would not refer him for home PT.  Explained expectations we have for patients who are referred to pulmonary rehab.  With his limited mobility he would not be appropriate.  Dr. Karna Christmas nurse asked if [facility] had similar protocol. Advised she would need to contact them. Alanson Aly, BSN Cardiac and Emergency planning/management officer

## 2021-01-21 ENCOUNTER — Other Ambulatory Visit (INDEPENDENT_AMBULATORY_CARE_PROVIDER_SITE_OTHER): Payer: Self-pay | Admitting: Nurse Practitioner

## 2021-01-21 DIAGNOSIS — Z9889 Other specified postprocedural states: Secondary | ICD-10-CM

## 2021-01-21 DIAGNOSIS — I739 Peripheral vascular disease, unspecified: Secondary | ICD-10-CM

## 2021-01-24 ENCOUNTER — Encounter (INDEPENDENT_AMBULATORY_CARE_PROVIDER_SITE_OTHER): Payer: Medicare Other

## 2021-01-24 ENCOUNTER — Ambulatory Visit (INDEPENDENT_AMBULATORY_CARE_PROVIDER_SITE_OTHER): Payer: Medicare Other | Admitting: Vascular Surgery

## 2021-01-31 ENCOUNTER — Other Ambulatory Visit (INDEPENDENT_AMBULATORY_CARE_PROVIDER_SITE_OTHER): Payer: Self-pay | Admitting: Vascular Surgery

## 2021-02-11 ENCOUNTER — Ambulatory Visit (INDEPENDENT_AMBULATORY_CARE_PROVIDER_SITE_OTHER): Payer: Medicare Other | Admitting: Vascular Surgery

## 2021-02-11 ENCOUNTER — Encounter (INDEPENDENT_AMBULATORY_CARE_PROVIDER_SITE_OTHER): Payer: Self-pay | Admitting: Vascular Surgery

## 2021-02-11 ENCOUNTER — Ambulatory Visit (INDEPENDENT_AMBULATORY_CARE_PROVIDER_SITE_OTHER): Payer: Medicare Other

## 2021-02-11 ENCOUNTER — Other Ambulatory Visit: Payer: Self-pay

## 2021-02-11 VITALS — BP 118/65 | HR 74 | Resp 16

## 2021-02-11 DIAGNOSIS — I70222 Atherosclerosis of native arteries of extremities with rest pain, left leg: Secondary | ICD-10-CM

## 2021-02-11 DIAGNOSIS — I25118 Atherosclerotic heart disease of native coronary artery with other forms of angina pectoris: Secondary | ICD-10-CM

## 2021-02-11 DIAGNOSIS — Z794 Long term (current) use of insulin: Secondary | ICD-10-CM

## 2021-02-11 DIAGNOSIS — E1142 Type 2 diabetes mellitus with diabetic polyneuropathy: Secondary | ICD-10-CM

## 2021-02-11 DIAGNOSIS — I739 Peripheral vascular disease, unspecified: Secondary | ICD-10-CM | POA: Diagnosis not present

## 2021-02-11 DIAGNOSIS — I10 Essential (primary) hypertension: Secondary | ICD-10-CM | POA: Diagnosis not present

## 2021-02-11 DIAGNOSIS — Z9889 Other specified postprocedural states: Secondary | ICD-10-CM | POA: Diagnosis not present

## 2021-02-11 DIAGNOSIS — I480 Paroxysmal atrial fibrillation: Secondary | ICD-10-CM

## 2021-02-11 NOTE — Progress Notes (Signed)
MRN : [id number]  Jesse Rivera is a 87 y.o. ([birth year]) male who presents with chief complaint of check legs.  History of Present Illness:   The patient returns to the office for followup and review status post angiogram with intervention on 09/11/2020.   Procedure: Percutaneous transluminal angioplasty and stent placement bilateral common iliac artery; "kissing balloon" technique 2.   Percutaneous transluminal angioplasty left distal popliteal artery to 4 mm with Lutonix drug-eluting balloon. 3.   Percutaneous transluminal angioplasty and stent placement right external iliac artery with a life star stent postdilated to 7 mm with a Lutonix drug-eluting balloon.  The patient notes improvement in the lower extremity symptoms. No interval shortening of the patient's claudication distance or rest pain symptoms. Previous wounds have now healed.  No new ulcers or wounds have occurred since the last visit.  There have been no significant changes to the patient's overall health care.  The patient denies amaurosis fugax or recent TIA symptoms. There are no recent neurological changes noted. The patient denies history of DVT, PE or superficial thrombophlebitis. The patient denies recent episodes of angina or shortness of breath.   ABI's Rt=[place] (TBI=0.59) and Lt=[place] (TBI=0.64)  (previous ABI's Rt=[place] (TBI=0.47) and Lt=[place] (TBI=0.41))   Current Meds  Medication Sig   albuterol (ACCUNEB) 1.25 MG/3ML nebulizer solution Inhale 1 ampule into the lungs 4 (four) times daily.   albuterol (VENTOLIN HFA) 108 (90 Base) MCG/ACT inhaler Inhale 2 puffs into the lungs every 6 (six) hours as needed for wheezing or shortness of breath.   allopurinol (ZYLOPRIM) 100 MG tablet Take 100 mg by mouth daily.   aspirin EC 81 MG tablet Take 81 mg by mouth daily.   atorvastatin (LIPITOR) 10 MG tablet Take 10 mg by mouth daily.   BD PEN NEEDLE NANO U/F 32G X 4 MM MISC U TO INJECT INSULIN D UTD   budesonide (PULMICORT) 0.5  MG/2ML nebulizer solution Take 0.5 mg by nebulization daily.   budesonide-formoterol (SYMBICORT) 160-4.5 MCG/ACT inhaler Inhale 2 puffs into the lungs 2 (two) times daily.   Cholecalciferol 25 MCG (1000 UT) tablet Take 1,000 Units by mouth daily.   clobetasol ointment (TEMOVATE) 0.05 % Apply to psoriasis areas on the body 1-2 times daily. Never apply to face.   clopidogrel (PLAVIX) 75 MG tablet TAKE 1 TABLET BY MOUTH EVERY DAY   COMBIVENT RESPIMAT 20-100 MCG/ACT AERS respimat Inhale 1 puff into the lungs 4 (four) times daily.   cyanocobalamin 100 MCG tablet Take by mouth.   cyclobenzaprine (FLEXERIL) 5 MG tablet Take by mouth.   Cysteamine Bitartrate (PROCYSBI) 300 MG PACK 3 (three) times daily To check blood sugar. diagnosis code: E11.42   Contour Next   diclofenac sodium (VOLTAREN) 1 % GEL APPLY A SMALL AMOUNT(2 GRAMS) TO THE AFFECTED AREA EVERY 12 HOURS AS NEEDED. 25 day supply   doxycycline (VIBRAMYCIN) 25 MG/5ML SUSR SMARTSIG:Milliliter(s) By Mouth   DULoxetine (CYMBALTA) 30 MG capsule Take by mouth.   DULoxetine (CYMBALTA) 30 MG capsule Take 30 mg by mouth daily.   fenofibrate (TRICOR) 48 MG tablet TK 1 T PO QD   Fluocinolone Acetonide Scalp 0.01 % OIL Apply to the ears daily as needed for itching.   fluticasone (FLONASE) 50 MCG/ACT nasal spray 2 sprays once daily   furosemide (LASIX) 40 MG tablet 1 tablet once daily   Guaifenesin 1200 MG TB12 Take by mouth.   hydrocortisone 2.5 % cream Apply to face 1-2 times daily   hydrOXYzine (ATARAX/VISTARIL) 25 MG  tablet TAKE 1 TABLET(25 MG) BY MOUTH TWICE DAILY AS NEEDED   hydrOXYzine (ATARAX/VISTARIL) 25 MG tablet Take 25 mg by mouth 2 (two) times daily.   insulin glargine (LANTUS) 100 UNIT/ML Solostar Pen Inject into the skin.   Ipratropium-Albuterol (COMBIVENT RESPIMAT) 20-100 MCG/ACT AERS respimat INHALE 2 INHALATIONS INTO THE LUNGS FOUR TIMES DAILY   isosorbide mononitrate (IMDUR) 30 MG 24 hr tablet 60 mg.    ketoconazole (NIZORAL) 2 %  shampoo    LANTUS SOLOSTAR 100 UNIT/ML Solostar Pen ADM 52 UNI [place] D   magnesium oxide (MAG-OX) 400 MG tablet    metolazone (ZAROXOLYN) 2.5 MG tablet TAKE 1 TABLET(2.5 MG) BY MOUTH EVERY DAY   metolazone (ZAROXOLYN) 2.5 MG tablet Take 2.5 mg by mouth daily.   metoprolol succinate (TOPROL-XL) 25 MG 24 hr tablet TK 1 T PO D   montelukast (SINGULAIR) 10 MG tablet    Nebulizers (VIOS LC SPRINT DELUXE) MISC USE TO ADMINISTER NEBULIZER SOLUTIONS   nitroGLYCERIN (NITROSTAT) 0.4 MG SL tablet DISSOLVE 1 T UNDER TONGUE Q 5 MINUTES UP TO 3 DOSES   omeprazole (PRILOSEC) 40 MG capsule 1 capsule 2 (two) times daily   predniSONE (DELTASONE) 10 MG tablet Take 10 mg by mouth daily.   predniSONE (DELTASONE) 20 MG tablet Take 20 mg by mouth daily.   tamsulosin (FLOMAX) 0.4 MG CAPS capsule TK 2 CS PO D   TIKOSYN 250 MCG capsule TK 1 C PO BID   tiotropium (SPIRIVA) 18 MCG inhalation capsule Place into inhaler and inhale.   torsemide (DEMADEX) 20 MG tablet Take by mouth.   torsemide (DEMADEX) 20 MG tablet Take 20 mg by mouth daily.   VENTOLIN HFA 108 (90 Base) MCG/ACT inhaler SMARTSIG:2 Inhalation Via Inhaler 4 Times Daily   warfarin (COUMADIN) 2.5 MG tablet Take by mouth.    Past Medical History:  Diagnosis Date   Arthritis    Chronic kidney disease    Stage 3   COPD (chronic obstructive pulmonary disease) (HCC)    Coronary artery disease    Diabetes mellitus without complication (HCC)    Dysrhythmia    Atrial Fibrillation   GERD (gastroesophageal reflux disease)    Gouty arthropathy, chronic, without tophi    Hyperlipidemia    Hypertension    Peripheral vascular disease (HCC)    Post bypass 1998   Sleep apnea     Past Surgical History:  Procedure Laterality Date   APPENDECTOMY     CHOLECYSTECTOMY     CORONARY ARTERY BYPASS GRAFT     01/12/18   FOOT SURGERY Bilateral    heart surgery'     triple bypass   LEG SURGERY Left    bypass surgery   LOWER EXTREMITY ANGIOGRAPHY Left 09/11/2020    Procedure: LOWER EXTREMITY ANGIOGRAPHY;  Surgeon: Renford Dills, MD;  Location: [facility] INVASIVE CV LAB;  Service: Cardiovascular;  Laterality: Left;   PACEMAKER PLACEMENT     SHOULDER SURGERY Left     Social History Social History   Tobacco Use   Smoking status: Former    Packs/day: 1.50    Years: 48.00    Pack years: 72.00    Types: Cigarettes    Quit date: 05/12/1996    Years since quitting: 24.7   Smokeless tobacco: Never  Vaping Use   Vaping Use: Never used  Substance Use Topics   Alcohol use: Not Currently   Drug use: Never    Family History Family History  Problem Relation Age of Onset  Stroke Mother    Heart attack Father     Allergies  Allergen Reactions   Cefaclor Anaphylaxis, Rash and Swelling   Erythromycin Base Rash and Swelling   Sulfa Antibiotics Itching, Rash and Swelling   Other Other (See Comments)    STERISTRIPS GLUE = [Rash], blisters, but tolerates paper tape NICOTINE PATCH GLUE = swelling Dental Bone Graft=itching, swelling, could not tolerate    Codeine Itching and Rash     REVIEW OF SYSTEMS (Negative unless checked)  Constitutional: [] Weight loss  [] Fever  [] Chills Cardiac: [] Chest pain   [] Chest pressure   [] Palpitations   [] Shortness of breath when laying flat   [] Shortness of breath with exertion. Vascular:  [] Pain in legs with walking   [] Pain in legs at rest  [] History of DVT   [] Phlebitis   [] Swelling in legs   [] Varicose veins   [] Non-healing ulcers Pulmonary:   [] Uses home oxygen   [] Productive cough   [] Hemoptysis   [] Wheeze  [] COPD   [] Asthma Neurologic:  [] Dizziness   [] Seizures   [] History of stroke   [] History of TIA  [] Aphasia   [] Vissual changes   [] Weakness or numbness in arm   [x] Weakness or numbness in leg Musculoskeletal:   [] Joint swelling   [] Joint pain   [] Low back pain Hematologic:  [] Easy bruising  [] Easy bleeding   [] Hypercoagulable state   [] Anemic Gastrointestinal:  [] Diarrhea   [] Vomiting  [] Gastroesophageal  reflux/heartburn   [] Difficulty swallowing. Genitourinary:  [] Chronic kidney disease   [] Difficult urination  [] Frequent urination   [] Blood in urine Skin:  [] Rashes   [] Ulcers  Psychological:  [] History of anxiety   []  History of major depression.  Physical Examination  Vitals:   02/11/21 1519  BP: 118/65  Pulse: 74  Resp: 16   There is no height or weight on file to calculate BMI. Gen: WD/WN, NAD seen in a wheelchair Head: [place]/AT, No temporalis wasting.  Ear/Nose/Throat: Hearing grossly intact, nares w/o erythema or drainage Eyes: PER, EOMI, sclera nonicteric.  Neck: Supple, no masses.  No bruit or JVD.  Pulmonary:  Good air movement, no audible wheezing, no use of accessory muscles. Continuous O2 by nasal cannula Cardiac: RRR, normal S1, S2, no Murmurs. Vascular:  both feet are cool to touch but no wounds or sores Vessel Right Left  Radial Palpable Palpable  PT Not Palpable Not Palpable  DP Not Palpable Not Palpable  Gastrointestinal: soft, non-distended. No guarding/no peritoneal signs.  Musculoskeletal: M/S 5/5 throughout.  No visible deformity.  Neurologic: CN 2-12 intact. Pain and light touch intact in extremities.  Symmetrical.  Speech is fluent. Motor exam as listed above. Psychiatric: Judgment intact, Mood & affect appropriate for pt's clinical situation. Dermatologic: No rashes or ulcers noted.  No changes consistent with cellulitis.   CBC No results found for: WBC, HGB, HCT, MCV, PLT  BMET    Component Value Date/Time   BUN 36 (H) 09/11/2020 0848   CREATININE 1.79 (H) 09/11/2020 0848   GFRNONAA 37 (L) 09/11/2020 0848   CrCl cannot be calculated (Patient's most recent lab result is older than the maximum 21 days allowed.).  COAG No results found for: INR, PROTIME  Radiology No results found.   Assessment/Plan 1. 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  The patient reports that the claudication symptoms and leg pain is essentially gone.   The patient denies lifestyle limiting changes at  this point in time.  No further invasive studies, angiography or surgery at this time The patient should continue walking and begin a more formal exercise program.  The patient should continue antiplatelet therapy and aggressive treatment of the lipid abnormalities  Smoking cessation was again discussed  The patient should continue wearing graduated compression socks 10-15 mmHg strength to control the mild edema.  Patient should undergo noninvasive studies as ordered. The patient will follow up with me after the studies.    - VAS [place] ABI WITH/WO TBI; Future  2. Essential hypertension Continue antihypertensive medications as already ordered, these medications have been reviewed and there are no changes at this time.   3. Paroxysmal atrial fibrillation (HCC) Continue antiarrhythmia medications as already ordered, these medications have been reviewed and there are no changes at this time.  Continue anticoagulation as ordered by Cardiology Service   4. Coronary artery disease of native artery of native heart with stable angina pectoris (HCC) Continue cardiac and antihypertensive medications as already ordered and reviewed, no changes at this time.  Continue statin as ordered and reviewed, no changes at this time  Nitrates PRN for chest pain   5. Type 2 diabetes mellitus with diabetic polyneuropathy, with long-term current use of insulin (HCC) Continue hypoglycemic medications as already ordered, these medications have been reviewed and there are no changes at this time.  Hgb A1C to be monitored as already arranged by primary service     Levora Dredge, MD  02/11/2021 3:31 PM

## 2021-04-01 ENCOUNTER — Encounter (INDEPENDENT_AMBULATORY_CARE_PROVIDER_SITE_OTHER): Payer: Medicare Other

## 2021-04-01 ENCOUNTER — Ambulatory Visit (INDEPENDENT_AMBULATORY_CARE_PROVIDER_SITE_OTHER): Payer: Medicare Other | Admitting: Vascular Surgery

## 2021-05-15 DIAGNOSIS — J449 Chronic obstructive pulmonary disease, unspecified: Secondary | ICD-10-CM | POA: Diagnosis not present

## 2021-05-18 DIAGNOSIS — J449 Chronic obstructive pulmonary disease, unspecified: Secondary | ICD-10-CM | POA: Diagnosis not present

## 2021-06-04 DIAGNOSIS — J449 Chronic obstructive pulmonary disease, unspecified: Secondary | ICD-10-CM | POA: Diagnosis not present

## 2021-06-07 DIAGNOSIS — H353211 Exudative age-related macular degeneration, right eye, with active choroidal neovascularization: Secondary | ICD-10-CM | POA: Diagnosis not present

## 2021-06-18 DIAGNOSIS — J449 Chronic obstructive pulmonary disease, unspecified: Secondary | ICD-10-CM | POA: Diagnosis not present

## 2021-06-26 DIAGNOSIS — E1142 Type 2 diabetes mellitus with diabetic polyneuropathy: Secondary | ICD-10-CM | POA: Diagnosis not present

## 2021-06-26 DIAGNOSIS — N183 Chronic kidney disease, stage 3 unspecified: Secondary | ICD-10-CM | POA: Diagnosis not present

## 2021-06-26 DIAGNOSIS — E1122 Type 2 diabetes mellitus with diabetic chronic kidney disease: Secondary | ICD-10-CM | POA: Diagnosis not present

## 2021-06-26 DIAGNOSIS — I482 Chronic atrial fibrillation, unspecified: Secondary | ICD-10-CM | POA: Diagnosis not present

## 2021-06-26 DIAGNOSIS — J9611 Chronic respiratory failure with hypoxia: Secondary | ICD-10-CM | POA: Diagnosis not present

## 2021-06-26 DIAGNOSIS — I2510 Atherosclerotic heart disease of native coronary artery without angina pectoris: Secondary | ICD-10-CM | POA: Diagnosis not present

## 2021-06-26 DIAGNOSIS — J441 Chronic obstructive pulmonary disease with (acute) exacerbation: Secondary | ICD-10-CM | POA: Diagnosis not present

## 2021-06-27 ENCOUNTER — Telehealth: Payer: Self-pay

## 2021-06-27 NOTE — Telephone Encounter (Signed)
No answer for call to scheduled follow up for palliative care

## 2021-07-16 DIAGNOSIS — J449 Chronic obstructive pulmonary disease, unspecified: Secondary | ICD-10-CM | POA: Diagnosis not present

## 2021-08-07 DIAGNOSIS — J449 Chronic obstructive pulmonary disease, unspecified: Secondary | ICD-10-CM | POA: Diagnosis not present

## 2021-08-12 ENCOUNTER — Encounter (INDEPENDENT_AMBULATORY_CARE_PROVIDER_SITE_OTHER): Payer: Medicare Other

## 2021-08-12 ENCOUNTER — Ambulatory Visit (INDEPENDENT_AMBULATORY_CARE_PROVIDER_SITE_OTHER): Payer: Medicare Other | Admitting: Nurse Practitioner

## 2021-08-16 DIAGNOSIS — J449 Chronic obstructive pulmonary disease, unspecified: Secondary | ICD-10-CM | POA: Diagnosis not present

## 2021-09-12 DIAGNOSIS — J449 Chronic obstructive pulmonary disease, unspecified: Secondary | ICD-10-CM | POA: Diagnosis not present

## 2021-09-15 DIAGNOSIS — J449 Chronic obstructive pulmonary disease, unspecified: Secondary | ICD-10-CM | POA: Diagnosis not present

## 2021-09-24 DIAGNOSIS — Z0100 Encounter for examination of eyes and vision without abnormal findings: Secondary | ICD-10-CM | POA: Diagnosis not present

## 2021-12-05 ENCOUNTER — Other Ambulatory Visit: Payer: Self-pay

## 2021-12-05 NOTE — Patient Outreach (Signed)
[facility]) Care Management  12/05/2021  Onofrio Klemp November 16, 1933 [id number]   Telephone Screen    Outreach call to patient to introduce [facility] services and assess care needs as part of benefit of PCP office and insurance plan. No answer after multiple rings and unable to leave message.       Plan: RN CM will make outreach attempt to patient within 4 business days. RN CM will send unsuccessful outreach letter to patient.   Antionette Fairy, RN,BSN,CCM [facility] Care Management Telephonic Care Management Coordinator Direct Phone: [phone] Toll Free: [phone] Fax: [phone]

## 2021-12-06 ENCOUNTER — Other Ambulatory Visit: Payer: Self-pay

## 2021-12-06 NOTE — Patient Outreach (Signed)
[facility]) Care Management  12/06/2021  Jesse Rivera [birth year] [id number]   Telephone Screen       Outreach call to patient to introduce [facility] services and assess care needs as part of benefit of PCP office and insurance plan.  Main healthcare issue/concern today: "unable to ambulate far without getting SOB" Patient reports that he gets short winded with minimal exertion. He admits to using inhalers more often than prescribed and running out of them early. Using oxygen at 5L/min cont. Patient in no acute distress during this call. However, instructed and strongly encouraged to contact lung MD to alert of sx's and med usage. He requested that his son was on the way to the hoe an he would have son all office for him to discuss with MD.  Health Maintenance/Care Gaps Addressed:  -Annual Wellness Visit -A1C -Diabetic Foot and Eye Exam  Conditions: Per chart review, patient has PMH that includes but not limited to COPD,DM,PAD, macular degeneration.  Medications Reviewed Today     Reviewed by Renford Dills, MD (Physician) on 02/11/21 at 1702  Med List Status: <None>   Medication Order Taking? Sig Documenting Provider Last Dose Status Informant  albuterol (ACCUNEB) 1.25 MG/3ML nebulizer solution [id number] Yes Inhale 1 ampule into the lungs 4 (four) times daily. [provider] Taking Active   albuterol (VENTOLIN HFA) 108 (90 Base) MCG/ACT inhaler [id number] Yes Inhale 2 puffs into the lungs every 6 (six) hours as needed for wheezing or shortness of breath. [provider] Taking Active   allopurinol (ZYLOPRIM) 100 MG tablet [id number] Yes Take 100 mg by mouth daily. [provider] Taking Active   aspirin EC 81 MG tablet [id number] Yes Take 81 mg by mouth daily. [provider] Taking Active   atorvastatin (LIPITOR) 10 MG tablet [id number] Yes Take 10 mg by mouth daily. [provider] Taking Active   BD PEN NEEDLE NANO U/F 32G  X 4 MM MISC [id number] Yes U TO INJECT INSULIN D UTD [provider] Taking Active   budesonide (PULMICORT) 0.5 MG/2ML nebulizer solution [id number] Yes Take 0.5 mg by nebulization daily. [provider] Taking Active   budesonide-formoterol (SYMBICORT) 160-4.5 MCG/ACT inhaler [id number] Yes Inhale 2 puffs into the lungs 2 (two) times daily. [provider] Taking Active   Cholecalciferol 25 MCG (1000 UT) tablet [id number] Yes Take 1,000 Units by mouth daily. [provider] Taking Active   clobetasol ointment (TEMOVATE) 0.05 % [id number] Yes Apply to psoriasis areas on the body 1-2 times daily. Never apply to face. [provider] Taking Active   clopidogrel (PLAVIX) 75 MG tablet [id number] Yes TAKE 1 TABLET BY MOUTH EVERY DAY Georgiana Spinner, NP Taking Active   COMBIVENT RESPIMAT 20-100 MCG/ACT AERS respimat [id number] Yes Inhale 1 puff into the lungs 4 (four) times daily. [provider] Taking Active   cyanocobalamin 100 MCG tablet [id number] Yes Take by mouth. [provider] Taking Active   cyclobenzaprine (FLEXERIL) 5 MG tablet [id number] Yes Take by mouth. [provider] Taking Active   Cysteamine Bitartrate (PROCYSBI) 300 MG PACK [id number] Yes 3 (three) times daily To check blood sugar. diagnosis code: E11.42   Contour Next [provider] Taking Active   diclofenac sodium (VOLTAREN) 1 % GEL [id number] Yes APPLY A SMALL AMOUNT(2 GRAMS) TO THE AFFECTED AREA EVERY 12 HOURS AS NEEDED. 25 day supply [provider] Taking Active   doxycycline (VIBRA-TABS) 100 MG tablet [id number] No Take 100 mg by mouth 2 (  two) times daily.  Patient not taking: No sig reported   [provider] Not Taking Active   doxycycline (VIBRAMYCIN) 25 MG/5ML SUSR [id number] Yes SMARTSIG:Milliliter(s) By Mouth [provider] Taking Active   DULoxetine (CYMBALTA) 30 MG capsule [id number] Yes Take by mouth. [provider] Taking Active   DULoxetine (CYMBALTA) 30 MG capsule [id number] Yes Take 30 mg by mouth daily. [provider] Taking Active   fenofibrate (TRICOR) 48 MG tablet [id number] Yes TK 1 T PO QD [provider] Taking Active   Fluocinolone Acetonide Scalp 0.01 % OIL [id number] Yes Apply to the ears daily as needed for itching. [provider] Taking Active   fluticasone (FLONASE) 50 MCG/ACT nasal spray [id number] Yes 2 sprays once daily [provider] Taking Active   furosemide (LASIX) 40 MG tablet [id number] Yes 1 tablet once daily [provider] Taking Active   Guaifenesin 1200 MG TB12 [id number] Yes Take by mouth. [provider] Taking Active   hydrocortisone 2.5 % cream [id number] Yes Apply to face 1-2 times daily [provider] Taking Active   hydrOXYzine (ATARAX/VISTARIL) 25 MG tablet [id number] Yes TAKE 1 TABLET(25 MG) BY MOUTH TWICE DAILY AS NEEDED [provider] Taking Active   hydrOXYzine (ATARAX/VISTARIL) 25 MG tablet [id number] Yes Take 25 mg by mouth 2 (two) times daily. [provider] Taking Active   insulin glargine (LANTUS) 100 UNIT/ML Solostar Pen [id number] Yes Inject into the skin. [provider] Taking Active   Ipratropium-Albuterol (COMBIVENT RESPIMAT) 20-100 MCG/ACT AERS respimat [id number] Yes INHALE 2 INHALATIONS INTO THE LUNGS FOUR TIMES DAILY [provider] Taking Active   isosorbide mononitrate (IMDUR) 30 MG 24 hr tablet [id number] Yes 60 mg.  [provider] Taking Active   ketoconazole (NIZORAL) 2 % shampoo [id number] Yes  [provider] Taking Active   LANTUS SOLOSTAR 100 UNIT/ML Solostar Pen [id number] Yes ADM 52 UNI [place] D [provider] Taking Active   magnesium oxide (MAG-OX) 400 MG tablet [id number] Yes  [provider] Taking Active   metolazone (ZAROXOLYN) 2.5 MG tablet [id number] Yes TAKE 1 TABLET(2.5 MG) BY MOUTH EVERY DAY [provider] Taking Active   metolazone (ZAROXOLYN) 2.5 MG tablet [id number] Yes Take 2.5 mg by mouth daily. [provider] Taking Active   metoprolol succinate (TOPROL-XL) 25 MG 24 hr tablet [id number] Yes TK 1 T PO D [provider] Taking Active   montelukast (SINGULAIR) 10 MG tablet [id number] Yes  [provider] Taking Active   Nebulizers (VIOS LC SPRINT DELUXE) MISC [id number] Yes USE TO ADMINISTER NEBULIZER SOLUTIONS [provider] Taking Active   nitroGLYCERIN (NITROSTAT) 0.4 MG SL tablet [id number] Yes DISSOLVE 1 T UNDER TONGUE Q 5 MINUTES UP TO 3 DOSES [provider] Taking Active   omeprazole (PRILOSEC) 40 MG capsule [id number] Yes 1 capsule 2 (two) times daily [provider] Taking Active   predniSONE (DELTASONE) 10 MG tablet [id number] Yes Take 10 mg by mouth daily. [provider] Taking Active   predniSONE (DELTASONE) 20 MG tablet [id number] Yes Take 20 mg by mouth daily. [provider] Taking Active   tamsulosin (FLOMAX) 0.4 MG CAPS capsule [id number] Yes TK 2 CS PO D [provider] Taking Active   TIKOSYN 250 MCG capsule [id number] Yes TK 1 C PO BID [provider] Taking Active   tiotropium (SPIRIVA) 18 MCG inhalation capsule [id number] Yes Place into inhaler and inhale. [provider] Taking Active   torsemide [facility])  20 MG tablet [id number] Yes Take by mouth. [provider] Taking Active   torsemide (DEMADEX) 20 MG tablet [id number] Yes Take 20 mg by mouth daily. [provider] Taking Active   VENTOLIN HFA 108 [phone] Base) MCG/ACT inhaler [id number] Yes SMARTSIG:2 Inhalation Via Inhaler 4 Times Daily [provider] Taking Active   warfarin (COUMADIN) 2.5 MG tablet [id number] Yes Take by mouth. [provider] Taking Active                12/06/2021   10:54 AM  Depression screen PHQ 2/9  Decreased Interest 0  Down, Depressed, Hopeless 0  PHQ  - 2 Score 0       12/06/2021   10:56 AM  Fall Risk  Falls in the past year? 1  Was there an injury with Fall? 1  Fall Risk Category Calculator 2  Fall Risk Category Moderate  Patient Fall Risk Level Moderate fall risk  Patient at Risk for Falls Due to Impaired balance/gait;Medication side effect  Fall risk Follow up Falls evaluation completed;Education provided    SDOH Screenings   Alcohol Screen: Not on file  Depression (PHQ2-9): Low Risk  (12/06/2021)   Depression (PHQ2-9)    PHQ-2 Score: 0  Financial Resource Strain: Not on file  Food Insecurity: No Food Insecurity (12/06/2021)   Hunger Vital Sign    Worried About Running Out of Food in the Last Year: Never true    Ran Out of Food in the Last Year: Never true  Housing: Not on file  Physical Activity: Not on file  Social Connections: Not on file  Stress: Not on file  Tobacco Use: Medium Risk (12/06/2021)   Patient History    Smoking Tobacco Use: Former    Smokeless Tobacco Use: Never    Passive Exposure: Not on file  Transportation Needs: No Transportation Needs (12/06/2021)   PRAPARE - Administrator, Civil Service (Medical): No    Lack of Transportation (Non-Medical): No   Care Plan : Medical illustrator POC  Updates made by Charlyn Minerva, RN since 12/06/2021 12:00 AM     Problem: Chronic Disease Mgmt of Chronic Condition-COPD   Priority: High     Long-Range Goal: Development of POC for Mgmt of Chronic Condition-COPD   Start Date: 12/06/2021  Expected End Date: 12/07/2022  Priority: High  Note:   Current Barriers:  Chronic Disease Management support and education needs related to COPD   RNCM Clinical Goal(s):  Patient will verbalize understanding of plan for management of COPD as evidenced by mgmt of chronic condition  through collaboration with RN Care manager, provider, and care team.   Interventions: 1:1 collaboration with primary care provider regarding development and update of  comprehensive plan of care as evidenced by provider attestation and co-signature Inter-disciplinary care team collaboration (see longitudinal plan of care) Evaluation of current treatment plan related to  self management and patient's adherence to plan as established by provider   COPD Interventions:  (Status:  New goal.) Long Term Goal Advised patient to track and manage COPD triggers Advised patient to self assesses COPD action plan zone and make appointment with provider if in the yellow zone for 48 hours without improvement Discussed the importance of adequate rest and management of fatigue with COPD Pt instructed to call lung MD office to alert of sxs and scheduled appt for eval  Patient Goals/Self-Care Activities: Take all medications as prescribed Attend all scheduled provider appointments Call provider office for new  concerns or questions  develop a rescue plan follow rescue plan if symptoms flare-up  Follow Up Plan:  Telephone follow up appointment with care management team member scheduled for:  within the month of Aug The patient has been provided with contact information for the care management team and has been advised to call with any health related questions or concerns.        Consent: [facility] services reviewed and discussed with patient. Verbal consent for services given.  Plan: RN CM discussed with patient next outreach within the month of Aug. Patient agrees to care plan and follow up. RN CM will send barriers letter and route encounter to PCP. RN CM will send welcome letter to patient. Antionette Fairy, RN,BSN,CCM [facility] Care Management Telephonic Care Management Coordinator Direct Phone: [phone] Toll Free: [phone] Fax: [phone]

## 2021-12-20 ENCOUNTER — Other Ambulatory Visit: Payer: Self-pay

## 2021-12-20 NOTE — Patient Outreach (Addendum)
[facility]) Care Management  12/20/2021  Jesse Rivera [birth year] [id number]   Telephone Assessment Follow Up Call    Spoke with patient. He voices that he is breathing is about the same. He did follow up and call lung MD as advised by Rn CM to alert them that he is taking inhalers more frequently that prescribed. He was given an early refill. Patient advised to come into see MD. Patient reports he has not made an appt. Strongly encouraged pt to be seen as he will continue to run out of meds early if Rx is not changed by MD and he continue to take more than ordered. He will think about it. No other RN CM needs at this time.   Care Plan : RN Care Manager POC  Updates made by Charlyn Minerva, RN since 12/20/2021 12:00 AM     Problem: Chronic Disease Mgmt of Chronic Condition-COPD   Priority: High     Long-Range Goal: Development of POC for Mgmt of Chronic Condition-COPD   Start Date: 12/06/2021  Expected End Date: 12/07/2022  This Visit's Progress: On track  Priority: High  Note:   Current Barriers:  Chronic Disease Management support and education needs related to COPD   RNCM Clinical Goal(s):  Patient will verbalize understanding of plan for management of COPD as evidenced by mgmt of chronic condition  through collaboration with RN Care manager, provider, and care team.   Interventions: 1:1 collaboration with primary care provider regarding development and update of comprehensive plan of care as evidenced by provider attestation and co-signature Inter-disciplinary care team collaboration (see longitudinal plan of care) Evaluation of current treatment plan related to  self management and patient's adherence to plan as established by provider   COPD Interventions:  (Status:  Goal on track:  Yes.) Long Term Goal Advised patient to track and manage COPD triggers Advised patient to self assesses COPD action plan zone and make appointment with provider  if in the yellow zone for 48 hours without improvement Discussed the importance of adequate rest and management of fatigue with COPD Pt instructed to call lung MD office to alert of sxs and scheduled appt for eval 12/20/21-patient reports breathing about the same. He did alert MD of using inhalers more than prescribed. He still needs to make MD appt and encouraged to do so ASAP.  Patient Goals/Self-Care Activities: Take all medications as prescribed Attend all scheduled provider appointments Call provider office for new concerns or questions  develop a rescue plan follow rescue plan if symptoms flare-up  Follow Up Plan:  Telephone follow up appointment with care management team member scheduled for:  within the month of Sept The patient has been provided with contact information for the care management team and has been advised to call with any health related questions or concerns.        Plan: RN CM discussed with patient next outreach within the month of Sept.  Patient agrees to care plan and follow up.  Antionette Fairy, RN,BSN,CCM [facility] Care Management Telephonic Care Management Coordinator Direct Phone: [phone] Toll Free: [phone] Fax: [phone]

## 2022-01-01 DIAGNOSIS — J449 Chronic obstructive pulmonary disease, unspecified: Secondary | ICD-10-CM | POA: Diagnosis not present

## 2022-01-09 ENCOUNTER — Other Ambulatory Visit: Payer: Self-pay

## 2022-01-09 NOTE — Patient Outreach (Signed)
[facility]) Care Management  01/09/2022  Jesse Rivera 25-Sep-1933 [id number]     Case Transfer  Pt will be followed for care coordination by [facility] Care Management. Assigned RN CM will outreach to patient.     Antionette Fairy, RN,BSN,CCM [facility] Care Management Telephonic Care Management Coordinator Direct Phone: [phone] Toll Free: [phone] Fax: [phone]

## 2022-02-26 DIAGNOSIS — E1151 Type 2 diabetes mellitus with diabetic peripheral angiopathy without gangrene: Secondary | ICD-10-CM | POA: Diagnosis not present

## 2022-02-26 DIAGNOSIS — Z0000 Encounter for general adult medical examination without abnormal findings: Secondary | ICD-10-CM | POA: Diagnosis not present

## 2022-02-26 DIAGNOSIS — J449 Chronic obstructive pulmonary disease, unspecified: Secondary | ICD-10-CM | POA: Diagnosis not present

## 2022-02-26 DIAGNOSIS — H6123 Impacted cerumen, bilateral: Secondary | ICD-10-CM | POA: Diagnosis not present

## 2022-02-26 DIAGNOSIS — J9611 Chronic respiratory failure with hypoxia: Secondary | ICD-10-CM | POA: Diagnosis not present

## 2022-02-26 DIAGNOSIS — Z8673 Personal history of transient ischemic attack (TIA), and cerebral infarction without residual deficits: Secondary | ICD-10-CM | POA: Diagnosis not present

## 2022-02-26 DIAGNOSIS — I482 Chronic atrial fibrillation, unspecified: Secondary | ICD-10-CM | POA: Diagnosis not present

## 2022-02-26 DIAGNOSIS — I509 Heart failure, unspecified: Secondary | ICD-10-CM | POA: Diagnosis not present

## 2022-02-26 DIAGNOSIS — E440 Moderate protein-calorie malnutrition: Secondary | ICD-10-CM | POA: Diagnosis not present

## 2022-03-20 ENCOUNTER — Encounter: Payer: Self-pay | Admitting: *Deleted

## 2022-03-24 ENCOUNTER — Ambulatory Visit: Payer: Self-pay | Admitting: *Deleted

## 2022-03-24 NOTE — Patient Outreach (Signed)
  Care Coordination   Initial Visit Note   03/24/2022 Name: Zacharius Funari MRN: [id number] DOB: [birth year]  Cezar Misiaszek is a 88 y.o. year old male who sees Danella Penton, MD for primary care. I spoke with  [name] by phone today.  What matters to the patients health and wellness today?  State he does not feel well and does not feel like talking, request call back tomorrow.  Denies urgent needs.   SDOH assessments and interventions completed:  No     Care Coordination Interventions Activated:  No  Care Coordination Interventions:  No, not indicated   Follow up plan: Follow up call scheduled for 11/14    Encounter Outcome:  Pt. Request to Call Back   Kemper Durie, RN, MSN, [facility] Care Management Care Management Coordinator [phone]

## 2022-03-25 ENCOUNTER — Ambulatory Visit: Payer: Self-pay | Admitting: *Deleted

## 2022-03-25 NOTE — Patient Outreach (Signed)
  Care Coordination   03/25/2022 Name: Axyl Sitzman MRN: [id number] DOB: Dec 03, 1933   Care Coordination Outreach Attempts:  An unsuccessful telephone outreach was attempted for a scheduled appointment today.  Follow Up Plan:  Additional outreach attempts will be made to offer the patient care coordination information and services.   Encounter Outcome:  No Answer  Care Coordination Interventions Activated:  No   Care Coordination Interventions:  No, not indicated    Kemper Durie, RN, MSN, [facility] Care Management Care Management Coordinator [phone]

## 2022-04-15 DIAGNOSIS — J449 Chronic obstructive pulmonary disease, unspecified: Secondary | ICD-10-CM | POA: Diagnosis not present

## 2022-04-15 DIAGNOSIS — R5381 Other malaise: Secondary | ICD-10-CM | POA: Diagnosis not present

## 2022-05-01 ENCOUNTER — Ambulatory Visit: Payer: Self-pay | Admitting: *Deleted

## 2022-05-01 NOTE — Patient Outreach (Signed)
  Care Coordination   05/01/2022 Name: Jesse Rivera MRN: [id number] DOB: 10-02-1933   Care Coordination Outreach Attempts:  An unsuccessful telephone outreach was attempted for a scheduled appointment today.  Follow Up Plan:  Additional outreach attempts will be made to offer the patient care coordination information and services.   Encounter Outcome:  No Answer   Care Coordination Interventions:  No, not indicated    Kemper Durie, RN, MSN, [facility] Care Management Care Management Coordinator [phone]

## 2022-05-14 ENCOUNTER — Ambulatory Visit: Payer: Self-pay | Admitting: *Deleted

## 2022-05-14 DIAGNOSIS — J441 Chronic obstructive pulmonary disease with (acute) exacerbation: Secondary | ICD-10-CM

## 2022-05-14 NOTE — Patient Outreach (Signed)
  Care Coordination   Initial Visit Note   05/14/2022 Name: Jesse Rivera MRN: [id number] DOB: 08/17/1933  [name] is a 89 y.o. year old male who sees Rusty Aus, MD for primary care. I spoke with  [name] by phone today.  What matters to the patients health and wellness today?  Keeping COPD managed and increasing strength    Goals Addressed             This Visit's Progress    Effective management of COPD       Care Coordination Interventions: Provided patient with basic written and verbal COPD education on self care/management/and exacerbation prevention Advised patient to track and manage COPD triggers Provided written and verbal instructions on pursed lip breathing and utilized returned demonstration as teach back Provided instruction about proper use of medications used for management of COPD including inhalers Provided education about and advised patient to utilize infection prevention strategies to reduce risk of respiratory infection Screening for signs and symptoms of depression related to chronic disease state  Assessed social determinant of health barriers Noted that per pulmonology note on 12/5 home health PT was ordered.  Patient denies that services has started.  Call placed to [facility], notified that [facility] accepted patient.  Call placed to [facility], notified that they attempted to reach patient several times prior to Christmas without success, referral was closed on 12/20.  They will need new referral to start services.  Call placed to pulmonology office to advise of new referral needed for HHPT  Patient advised to listen for call and if unable to answer phone, to call back to agency.   Reminded to rinse mouth after using inhaler.  Confirms he is still using 5 Liter of home oxygen.  Does not always monitor saturation levels but does have equipment.   Pulmonology appointment for 3/15, reminded to call PCP to schedule 6 month follow up, last seen in October  2023. Discussed transportation, neither he or his wife drives. Advised to call [employer] to discuss benefit of transportation. Will also place referral community resource care guide.  Use [employer] delivery for groceries.         SDOH assessments and interventions completed:  Yes  SDOH Interventions Today    Flowsheet Row Most Recent Value  SDOH Interventions   Food Insecurity Interventions Intervention Not Indicated  Housing Interventions Intervention Not Indicated  Transportation Interventions Intervention Not Indicated        Care Coordination Interventions:  Yes, provided   Follow up plan: Follow up call scheduled for 1/12    Encounter Outcome:  Pt. Visit Completed   [name], RN, MSN, [facility] Care Management Care Management Coordinator [phone]

## 2022-05-15 ENCOUNTER — Telehealth: Payer: Self-pay | Admitting: *Deleted

## 2022-05-15 NOTE — Telephone Encounter (Signed)
   Telephone encounter was:  Successful.  05/15/2022 Name: Jesse Rivera MRN: [id number] DOB: [birth year]  Jesse Rivera is a 89 y.o. year old male who is a primary care patient of Rusty Aus, MD . The community resource team was consulted for assistance with Transportation Needs  talked with patient and provided information regarding transportation thorough the insurance also provided information on [employer] [place] for aditional transportation benefits  Care guide performed the following interventions: Patient provided with information about care guide support team and interviewed to confirm resource needs.  Follow Up Plan:  No further follow up planned at this time. The patient has been provided with needed resources. [place] [phone] [address] , [place] [address] Email : [email or web address]

## 2022-05-23 ENCOUNTER — Ambulatory Visit: Payer: Self-pay | Admitting: *Deleted

## 2022-05-23 NOTE — Patient Outreach (Signed)
  Care Coordination   05/23/2022 Name: Jesse Rivera MRN: [id number] DOB: Jul 20, 1933   Care Coordination Outreach Attempts:  An unsuccessful telephone outreach was attempted for a scheduled appointment today.  Follow Up Plan:  Additional outreach attempts will be made to offer the patient care coordination information and services.   Encounter Outcome:  No Answer   Care Coordination Interventions:  No, not indicated    [name], RN, MSN, [facility] Care Management Care Management Coordinator [phone]

## 2022-08-01 DIAGNOSIS — J449 Chronic obstructive pulmonary disease, unspecified: Secondary | ICD-10-CM | POA: Diagnosis not present

## 2022-08-18 DIAGNOSIS — E1142 Type 2 diabetes mellitus with diabetic polyneuropathy: Secondary | ICD-10-CM | POA: Diagnosis not present

## 2022-08-18 DIAGNOSIS — J449 Chronic obstructive pulmonary disease, unspecified: Secondary | ICD-10-CM | POA: Diagnosis not present

## 2022-08-18 DIAGNOSIS — Z794 Long term (current) use of insulin: Secondary | ICD-10-CM | POA: Diagnosis not present

## 2022-08-18 DIAGNOSIS — J9610 Chronic respiratory failure, unspecified whether with hypoxia or hypercapnia: Secondary | ICD-10-CM | POA: Diagnosis not present

## 2022-08-18 DIAGNOSIS — M1A00X0 Idiopathic chronic gout, unspecified site, without tophus (tophi): Secondary | ICD-10-CM | POA: Diagnosis not present

## 2022-08-18 DIAGNOSIS — I482 Chronic atrial fibrillation, unspecified: Secondary | ICD-10-CM | POA: Diagnosis not present

## 2022-08-18 DIAGNOSIS — E1151 Type 2 diabetes mellitus with diabetic peripheral angiopathy without gangrene: Secondary | ICD-10-CM | POA: Diagnosis not present

## 2022-08-22 ENCOUNTER — Telehealth: Payer: Self-pay | Admitting: *Deleted

## 2022-08-22 DIAGNOSIS — J441 Chronic obstructive pulmonary disease with (acute) exacerbation: Secondary | ICD-10-CM

## 2022-08-22 NOTE — Patient Outreach (Signed)
  Care Coordination   Follow Up Visit Note   08/22/2022 Name: Jesse Rivera MRN: [id number] DOB: 1933-11-19  Jesse Rivera is a 89 y.o. year old male who sees Danella Penton, MD for primary care. I spoke with  Jesse Rivera by phone today.  What matters to the patients health and wellness today?  Continues to work on breathing problems, uses oxygen 24/7.    Goals Addressed             This Visit's Progress    Effective management of COPD   On track    Care Coordination Interventions: Provided patient with basic written and verbal COPD education on self care/management/and exacerbation prevention Advised patient to track and manage COPD triggers Provided written and verbal instructions on pursed lip breathing and utilized returned demonstration as teach back Provided instruction about proper use of medications used for management of COPD including inhalers Provided education about and advised patient to utilize infection prevention strategies to reduce risk of respiratory infection Noted that per pulmonology, plan to have PFT in June.   Reminded to rinse mouth after using inhaler.  Confirms he is still using 5 Liter of home oxygen.  Does not always monitor saturation levels but does have equipment.   PCP seen on 4/8 Discussed transportation, neither he or his wife drives. Advised to call [employer] to discuss benefit of transportation. Will also place referral community resource care guide.  Use [employer] delivery for groceries. Discussed having meals on wheels, agrees to referral         SDOH assessments and interventions completed:  No     Care Coordination Interventions:  Yes, provided   Interventions Today    Flowsheet Row Most Recent Value  Chronic Disease   Chronic disease during today's visit Chronic Obstructive Pulmonary Disease (COPD)  General Interventions   General Interventions Discussed/Reviewed General Interventions Reviewed, Communication with, [employer], Doctor Visits  Doctor Visits Discussed/Reviewed Doctor Visits Reviewed, PCP, Specialist  PCP/Specialist Visits Compliance with follow-up visit  Communication with --  Lavinia Sharps guide for meals on wheels]       Follow up plan: Referral made to care guide Follow up call scheduled for 4/25    Encounter Outcome:  Pt. Visit Completed   Kemper Durie, RN, MSN, CCM [facility] Care Management Care Management Coordinator [phone]

## 2022-08-22 NOTE — Addendum Note (Signed)
Addended byKemper Durie on: 08/22/2022 08:16 PM   Modules accepted: Orders

## 2022-08-26 ENCOUNTER — Telehealth: Payer: Self-pay

## 2022-08-26 NOTE — Telephone Encounter (Signed)
   Telephone encounter was:  Unsuccessful.  08/26/2022 Name: Jesse Rivera MRN: [id number] DOB: [birth year]  Unsuccessful outbound call made today to assist with:  Food Insecurity  Outreach Attempt:  1st Attempt  A HIPAA compliant voice message was left requesting a return call.  Instructed patient to call back at [phone]. Left message on voicemail for patient to return my call regarding referral for Meals on Wheels.  Daissy Yerian [facility]  [facility] Population Health Community Resource Care Guide   [email or web address]  ?? [phone]   Website: [email or web address]  [email or web address]

## 2022-08-27 ENCOUNTER — Telehealth: Payer: Self-pay | Admitting: *Deleted

## 2022-08-27 ENCOUNTER — Telehealth: Payer: Self-pay

## 2022-08-27 NOTE — Patient Outreach (Signed)
  Care Coordination   Follow Up Visit Note   08/28/2022 Name: Jesse Rivera MRN: [id number] DOB: [birth year]  Jesse Rivera is a 89 y.o. year old male who sees Jesse Penton, MD for primary care.   What matters to the patients health and wellness today?  Call received from community resource care guide to discuss referral for meals on wheels, patient has declined offer, but resource in [place] will still follow up with patient directly.       SDOH assessments and interventions completed:  No     Care Coordination Interventions:  Yes, provided   Interventions Today    Flowsheet Row Most Recent Value  Chronic Disease   Chronic disease during today's visit Chronic Obstructive Pulmonary Disease (COPD)  General Interventions   General Interventions Discussed/Reviewed Communication with  Communication with Social Work  [Spoke to Care guide regarding meals, patient declined]  Education Interventions   Education Provided Provided Education  Provided Verbal Education On [employer] on wheels discussed, care guide will close case but will have community resource for meals call patient directly]        Follow up plan: Follow up call scheduled for 4/25    Encounter Outcome:  Pt. Visit Completed   Kemper Durie, RN, MSN, [facility] Care Management Care Management Coordinator [phone]

## 2022-08-27 NOTE — Telephone Encounter (Signed)
   Telephone encounter was:  Successful.  08/27/2022 Name: Jesse Rivera MRN: [id number] DOB: [birth year]  Jesse Rivera is a 89 y.o. year old male who is a primary care patient of Danella Penton, MD . The community resource team was consulted for assistance with Food Insecurity  Care guide performed the following interventions: Spoke with patient about Meals on Wheels, the hot meal route is full but they can pickup the frozen meals every two weeks. Spoke with Jesse Rivera at [employer] on Wheels she will call the patient tomorrow and explain the options. Jesse Rivera information for patient and his wife and they have been added to the program.  Follow Up Plan:  No further follow up planned at this time. The patient has been provided with needed resources.  Jesse Rivera Jesse [facility]  [facility] Population Health Community Resource Care Guide   [email or web address]  ?? [phone]   Website: [email or web address]  [email or web address]

## 2022-09-04 ENCOUNTER — Ambulatory Visit: Payer: Self-pay | Admitting: *Deleted

## 2022-09-04 NOTE — Patient Outreach (Signed)
  Care Coordination   Follow Up Visit Note   09/04/2022 Name: Jesse Rivera MRN: [id number] DOB: 06/16/1933  Jesse Rivera is a 89 y.o. year old male who sees Jesse Penton, MD for primary care. I spoke with  Jesse Rivera by phone today.  What matters to the patients health and wellness today?  Manage COPD and stay independent.     Goals Addressed             This Visit's Progress    Effective management of COPD   On track    Care Coordination Interventions: Provided patient with basic written and verbal COPD education on self care/management/and exacerbation prevention Advised patient to track and manage COPD triggers Provided written and verbal instructions on pursed lip breathing and utilized returned demonstration as teach back Provided instruction about proper use of medications used for management of COPD including inhalers Provided education about and advised patient to utilize infection prevention strategies to reduce risk of respiratory infection Noted that per pulmonology, plan to have PFT in June.   Discussed having meals on wheels, Reminded of benefits and encouraged to reconsider as he recently declined offer.          SDOH assessments and interventions completed:  No     Care Coordination Interventions:  Yes, provided   Interventions Today    Flowsheet Row Most Recent Value  Chronic Disease   Chronic disease during today's visit Chronic Obstructive Pulmonary Disease (COPD)  General Interventions   General Interventions Discussed/Reviewed General Interventions Reviewed, [employer], Doctor Visits  [meals on wheels]  Doctor Visits Discussed/Reviewed Doctor Visits Reviewed, PCP  PCP/Specialist Visits Compliance with follow-up visit        Follow up plan: Follow up call scheduled for 5/13    Encounter Outcome:  Pt. Visit Completed   Kemper Durie, RN, MSN, [facility] Care Management Care Management Coordinator [phone]

## 2022-09-16 DIAGNOSIS — J449 Chronic obstructive pulmonary disease, unspecified: Secondary | ICD-10-CM | POA: Diagnosis not present

## 2022-09-16 DIAGNOSIS — N183 Chronic kidney disease, stage 3 unspecified: Secondary | ICD-10-CM | POA: Diagnosis not present

## 2022-09-16 DIAGNOSIS — I482 Chronic atrial fibrillation, unspecified: Secondary | ICD-10-CM | POA: Diagnosis not present

## 2022-09-16 DIAGNOSIS — E1151 Type 2 diabetes mellitus with diabetic peripheral angiopathy without gangrene: Secondary | ICD-10-CM | POA: Diagnosis not present

## 2022-09-16 DIAGNOSIS — I25810 Atherosclerosis of coronary artery bypass graft(s) without angina pectoris: Secondary | ICD-10-CM | POA: Diagnosis not present

## 2022-09-16 DIAGNOSIS — J9611 Chronic respiratory failure with hypoxia: Secondary | ICD-10-CM | POA: Diagnosis not present

## 2022-09-16 DIAGNOSIS — K219 Gastro-esophageal reflux disease without esophagitis: Secondary | ICD-10-CM | POA: Diagnosis not present

## 2022-09-16 DIAGNOSIS — E1122 Type 2 diabetes mellitus with diabetic chronic kidney disease: Secondary | ICD-10-CM | POA: Diagnosis not present

## 2022-09-16 DIAGNOSIS — E1142 Type 2 diabetes mellitus with diabetic polyneuropathy: Secondary | ICD-10-CM | POA: Diagnosis not present

## 2022-09-22 ENCOUNTER — Ambulatory Visit: Payer: Self-pay | Admitting: *Deleted

## 2022-09-22 DIAGNOSIS — N183 Chronic kidney disease, stage 3 unspecified: Secondary | ICD-10-CM | POA: Diagnosis not present

## 2022-09-22 DIAGNOSIS — I482 Chronic atrial fibrillation, unspecified: Secondary | ICD-10-CM | POA: Diagnosis not present

## 2022-09-22 DIAGNOSIS — E1122 Type 2 diabetes mellitus with diabetic chronic kidney disease: Secondary | ICD-10-CM | POA: Diagnosis not present

## 2022-09-22 DIAGNOSIS — J449 Chronic obstructive pulmonary disease, unspecified: Secondary | ICD-10-CM | POA: Diagnosis not present

## 2022-09-22 DIAGNOSIS — I25810 Atherosclerosis of coronary artery bypass graft(s) without angina pectoris: Secondary | ICD-10-CM | POA: Diagnosis not present

## 2022-09-22 DIAGNOSIS — E1142 Type 2 diabetes mellitus with diabetic polyneuropathy: Secondary | ICD-10-CM | POA: Diagnosis not present

## 2022-09-22 DIAGNOSIS — K219 Gastro-esophageal reflux disease without esophagitis: Secondary | ICD-10-CM | POA: Diagnosis not present

## 2022-09-22 DIAGNOSIS — E1151 Type 2 diabetes mellitus with diabetic peripheral angiopathy without gangrene: Secondary | ICD-10-CM | POA: Diagnosis not present

## 2022-09-22 DIAGNOSIS — J9611 Chronic respiratory failure with hypoxia: Secondary | ICD-10-CM | POA: Diagnosis not present

## 2022-09-22 NOTE — Patient Outreach (Signed)
  Care Coordination   09/22/2022 Name: Jesse Rivera MRN: [id number] DOB: 04-25-1934   Care Coordination Outreach Attempts:  An unsuccessful telephone outreach was attempted for a scheduled appointment today.  Call attempted twice, both unsuccessful, unable to leave message.  Follow Up Plan:  Additional outreach attempts will be made to offer the patient care coordination information and services.   Encounter Outcome:  No Answer   Care Coordination Interventions:  No, not indicated    Kemper Durie, RN, MSN, [facility] Care Management Care Management Coordinator [phone]

## 2022-09-24 DIAGNOSIS — N183 Chronic kidney disease, stage 3 unspecified: Secondary | ICD-10-CM | POA: Diagnosis not present

## 2022-09-24 DIAGNOSIS — E1151 Type 2 diabetes mellitus with diabetic peripheral angiopathy without gangrene: Secondary | ICD-10-CM | POA: Diagnosis not present

## 2022-09-24 DIAGNOSIS — E1122 Type 2 diabetes mellitus with diabetic chronic kidney disease: Secondary | ICD-10-CM | POA: Diagnosis not present

## 2022-09-24 DIAGNOSIS — J9611 Chronic respiratory failure with hypoxia: Secondary | ICD-10-CM | POA: Diagnosis not present

## 2022-09-24 DIAGNOSIS — J449 Chronic obstructive pulmonary disease, unspecified: Secondary | ICD-10-CM | POA: Diagnosis not present

## 2022-09-24 DIAGNOSIS — I25810 Atherosclerosis of coronary artery bypass graft(s) without angina pectoris: Secondary | ICD-10-CM | POA: Diagnosis not present

## 2022-09-24 DIAGNOSIS — I482 Chronic atrial fibrillation, unspecified: Secondary | ICD-10-CM | POA: Diagnosis not present

## 2022-09-26 DIAGNOSIS — E1122 Type 2 diabetes mellitus with diabetic chronic kidney disease: Secondary | ICD-10-CM | POA: Diagnosis not present

## 2022-09-26 DIAGNOSIS — J449 Chronic obstructive pulmonary disease, unspecified: Secondary | ICD-10-CM | POA: Diagnosis not present

## 2022-09-26 DIAGNOSIS — N183 Chronic kidney disease, stage 3 unspecified: Secondary | ICD-10-CM | POA: Diagnosis not present

## 2022-09-26 DIAGNOSIS — E1142 Type 2 diabetes mellitus with diabetic polyneuropathy: Secondary | ICD-10-CM | POA: Diagnosis not present

## 2022-09-26 DIAGNOSIS — I25810 Atherosclerosis of coronary artery bypass graft(s) without angina pectoris: Secondary | ICD-10-CM | POA: Diagnosis not present

## 2022-09-26 DIAGNOSIS — K219 Gastro-esophageal reflux disease without esophagitis: Secondary | ICD-10-CM | POA: Diagnosis not present

## 2022-09-26 DIAGNOSIS — J9611 Chronic respiratory failure with hypoxia: Secondary | ICD-10-CM | POA: Diagnosis not present

## 2022-09-26 DIAGNOSIS — I482 Chronic atrial fibrillation, unspecified: Secondary | ICD-10-CM | POA: Diagnosis not present

## 2022-09-26 DIAGNOSIS — E1151 Type 2 diabetes mellitus with diabetic peripheral angiopathy without gangrene: Secondary | ICD-10-CM | POA: Diagnosis not present

## 2022-10-02 DIAGNOSIS — N183 Chronic kidney disease, stage 3 unspecified: Secondary | ICD-10-CM | POA: Diagnosis not present

## 2022-10-02 DIAGNOSIS — E1142 Type 2 diabetes mellitus with diabetic polyneuropathy: Secondary | ICD-10-CM | POA: Diagnosis not present

## 2022-10-02 DIAGNOSIS — E1151 Type 2 diabetes mellitus with diabetic peripheral angiopathy without gangrene: Secondary | ICD-10-CM | POA: Diagnosis not present

## 2022-10-02 DIAGNOSIS — I482 Chronic atrial fibrillation, unspecified: Secondary | ICD-10-CM | POA: Diagnosis not present

## 2022-10-02 DIAGNOSIS — J449 Chronic obstructive pulmonary disease, unspecified: Secondary | ICD-10-CM | POA: Diagnosis not present

## 2022-10-02 DIAGNOSIS — I25810 Atherosclerosis of coronary artery bypass graft(s) without angina pectoris: Secondary | ICD-10-CM | POA: Diagnosis not present

## 2022-10-02 DIAGNOSIS — E1122 Type 2 diabetes mellitus with diabetic chronic kidney disease: Secondary | ICD-10-CM | POA: Diagnosis not present

## 2022-10-02 DIAGNOSIS — J9611 Chronic respiratory failure with hypoxia: Secondary | ICD-10-CM | POA: Diagnosis not present

## 2022-10-02 DIAGNOSIS — K219 Gastro-esophageal reflux disease without esophagitis: Secondary | ICD-10-CM | POA: Diagnosis not present

## 2022-10-03 ENCOUNTER — Telehealth: Payer: Self-pay | Admitting: *Deleted

## 2022-10-03 NOTE — Progress Notes (Signed)
  Care Coordination Note  10/03/2022 Name: Samit Sisti MRN: [id number] DOB: 11-07-1933  Valen Landwehr is a 89 y.o. year old male who is a primary care patient of Danella Penton, MD and is actively engaged with the care management team. I reached out to [name] by phone today to assist with re-scheduling a follow up visit with the RN Case Manager  Follow up plan: Unsuccessful telephone outreach attempt made. A HIPAA compliant phone message was left for the patient providing contact information and requesting a return call.   Burman Nieves, CCMA Care Coordination Care Guide Direct Dial: [phone]

## 2022-10-24 NOTE — Progress Notes (Signed)
  Care Coordination Note  10/24/2022 Name: Jesse Rivera MRN: [id number] DOB: July 27, 1933  Jesse Rivera is a 89 y.o. year old male who is a primary care patient of Danella Penton, MD and is actively engaged with the care management team. I reached out to [name] by phone today to assist with re-scheduling a follow up visit with the RN Case Manager  Follow up plan: We have been unable to make contact with the patient for follow up.   Burman Nieves, CCMA Care Coordination Care Guide Direct Dial: [phone]

## 2022-10-29 DIAGNOSIS — R0781 Pleurodynia: Secondary | ICD-10-CM | POA: Diagnosis not present

## 2022-10-29 DIAGNOSIS — I739 Peripheral vascular disease, unspecified: Secondary | ICD-10-CM | POA: Diagnosis not present

## 2022-10-29 DIAGNOSIS — I482 Chronic atrial fibrillation, unspecified: Secondary | ICD-10-CM | POA: Diagnosis not present

## 2022-10-29 DIAGNOSIS — I5032 Chronic diastolic (congestive) heart failure: Secondary | ICD-10-CM | POA: Diagnosis not present

## 2022-10-29 DIAGNOSIS — E43 Unspecified severe protein-calorie malnutrition: Secondary | ICD-10-CM | POA: Diagnosis not present

## 2022-10-29 DIAGNOSIS — R110 Nausea: Secondary | ICD-10-CM | POA: Diagnosis not present

## 2022-10-29 DIAGNOSIS — M25561 Pain in right knee: Secondary | ICD-10-CM | POA: Diagnosis not present

## 2022-10-29 DIAGNOSIS — I2510 Atherosclerotic heart disease of native coronary artery without angina pectoris: Secondary | ICD-10-CM | POA: Diagnosis not present

## 2022-10-29 DIAGNOSIS — M6281 Muscle weakness (generalized): Secondary | ICD-10-CM | POA: Diagnosis not present

## 2022-10-29 DIAGNOSIS — R1310 Dysphagia, unspecified: Secondary | ICD-10-CM | POA: Diagnosis not present

## 2022-10-29 DIAGNOSIS — W19XXXA Unspecified fall, initial encounter: Secondary | ICD-10-CM | POA: Diagnosis not present

## 2022-10-29 DIAGNOSIS — L89319 Pressure ulcer of right buttock, unspecified stage: Secondary | ICD-10-CM | POA: Diagnosis not present

## 2022-10-29 DIAGNOSIS — E162 Hypoglycemia, unspecified: Secondary | ICD-10-CM | POA: Diagnosis not present

## 2022-10-29 DIAGNOSIS — J9611 Chronic respiratory failure with hypoxia: Secondary | ICD-10-CM | POA: Diagnosis not present

## 2022-10-29 DIAGNOSIS — E11649 Type 2 diabetes mellitus with hypoglycemia without coma: Secondary | ICD-10-CM | POA: Diagnosis not present

## 2022-10-29 DIAGNOSIS — R531 Weakness: Secondary | ICD-10-CM | POA: Diagnosis not present

## 2022-10-29 DIAGNOSIS — R0602 Shortness of breath: Secondary | ICD-10-CM | POA: Diagnosis not present

## 2022-10-29 DIAGNOSIS — I6381 Other cerebral infarction due to occlusion or stenosis of small artery: Secondary | ICD-10-CM | POA: Diagnosis not present

## 2022-10-29 DIAGNOSIS — I491 Atrial premature depolarization: Secondary | ICD-10-CM | POA: Diagnosis not present

## 2022-10-29 DIAGNOSIS — Z743 Need for continuous supervision: Secondary | ICD-10-CM | POA: Diagnosis not present

## 2022-10-29 DIAGNOSIS — I130 Hypertensive heart and chronic kidney disease with heart failure and stage 1 through stage 4 chronic kidney disease, or unspecified chronic kidney disease: Secondary | ICD-10-CM | POA: Diagnosis not present

## 2022-10-29 DIAGNOSIS — J449 Chronic obstructive pulmonary disease, unspecified: Secondary | ICD-10-CM | POA: Diagnosis not present

## 2022-10-29 DIAGNOSIS — K5900 Constipation, unspecified: Secondary | ICD-10-CM | POA: Diagnosis not present

## 2022-10-29 DIAGNOSIS — J984 Other disorders of lung: Secondary | ICD-10-CM | POA: Diagnosis not present

## 2022-10-29 DIAGNOSIS — E1122 Type 2 diabetes mellitus with diabetic chronic kidney disease: Secondary | ICD-10-CM | POA: Diagnosis not present

## 2022-11-03 DIAGNOSIS — Z79899 Other long term (current) drug therapy: Secondary | ICD-10-CM | POA: Diagnosis not present

## 2022-11-03 DIAGNOSIS — E11649 Type 2 diabetes mellitus with hypoglycemia without coma: Secondary | ICD-10-CM | POA: Diagnosis not present

## 2022-11-03 DIAGNOSIS — L89319 Pressure ulcer of right buttock, unspecified stage: Secondary | ICD-10-CM | POA: Diagnosis not present

## 2022-11-03 DIAGNOSIS — N183 Chronic kidney disease, stage 3 unspecified: Secondary | ICD-10-CM | POA: Diagnosis not present

## 2022-11-03 DIAGNOSIS — J9611 Chronic respiratory failure with hypoxia: Secondary | ICD-10-CM | POA: Diagnosis not present

## 2022-11-03 DIAGNOSIS — R531 Weakness: Secondary | ICD-10-CM | POA: Diagnosis not present

## 2022-11-03 DIAGNOSIS — R635 Abnormal weight gain: Secondary | ICD-10-CM | POA: Diagnosis not present

## 2022-11-03 DIAGNOSIS — I5032 Chronic diastolic (congestive) heart failure: Secondary | ICD-10-CM | POA: Diagnosis not present

## 2022-11-03 DIAGNOSIS — E559 Vitamin D deficiency, unspecified: Secondary | ICD-10-CM | POA: Diagnosis not present

## 2022-11-03 DIAGNOSIS — M6281 Muscle weakness (generalized): Secondary | ICD-10-CM | POA: Diagnosis not present

## 2022-11-03 DIAGNOSIS — M109 Gout, unspecified: Secondary | ICD-10-CM | POA: Diagnosis not present

## 2022-11-03 DIAGNOSIS — Z743 Need for continuous supervision: Secondary | ICD-10-CM | POA: Diagnosis not present

## 2022-11-03 DIAGNOSIS — K5900 Constipation, unspecified: Secondary | ICD-10-CM | POA: Diagnosis not present

## 2022-11-03 DIAGNOSIS — I130 Hypertensive heart and chronic kidney disease with heart failure and stage 1 through stage 4 chronic kidney disease, or unspecified chronic kidney disease: Secondary | ICD-10-CM | POA: Diagnosis not present

## 2022-11-03 DIAGNOSIS — E119 Type 2 diabetes mellitus without complications: Secondary | ICD-10-CM | POA: Diagnosis not present

## 2022-11-03 DIAGNOSIS — E162 Hypoglycemia, unspecified: Secondary | ICD-10-CM | POA: Diagnosis not present

## 2022-11-03 DIAGNOSIS — W19XXXD Unspecified fall, subsequent encounter: Secondary | ICD-10-CM | POA: Diagnosis not present

## 2022-11-03 DIAGNOSIS — E43 Unspecified severe protein-calorie malnutrition: Secondary | ICD-10-CM | POA: Diagnosis not present

## 2022-11-03 DIAGNOSIS — I2510 Atherosclerotic heart disease of native coronary artery without angina pectoris: Secondary | ICD-10-CM | POA: Diagnosis not present

## 2022-11-03 DIAGNOSIS — I739 Peripheral vascular disease, unspecified: Secondary | ICD-10-CM | POA: Diagnosis not present

## 2022-11-03 DIAGNOSIS — R1310 Dysphagia, unspecified: Secondary | ICD-10-CM | POA: Diagnosis not present

## 2022-11-03 DIAGNOSIS — J449 Chronic obstructive pulmonary disease, unspecified: Secondary | ICD-10-CM | POA: Diagnosis not present

## 2022-11-03 DIAGNOSIS — I482 Chronic atrial fibrillation, unspecified: Secondary | ICD-10-CM | POA: Diagnosis not present

## 2022-11-03 DIAGNOSIS — E1122 Type 2 diabetes mellitus with diabetic chronic kidney disease: Secondary | ICD-10-CM | POA: Diagnosis not present

## 2022-11-04 DIAGNOSIS — J9611 Chronic respiratory failure with hypoxia: Secondary | ICD-10-CM | POA: Diagnosis not present

## 2022-11-04 DIAGNOSIS — E11649 Type 2 diabetes mellitus with hypoglycemia without coma: Secondary | ICD-10-CM | POA: Diagnosis not present

## 2022-11-04 DIAGNOSIS — J449 Chronic obstructive pulmonary disease, unspecified: Secondary | ICD-10-CM | POA: Diagnosis not present

## 2022-11-04 DIAGNOSIS — M109 Gout, unspecified: Secondary | ICD-10-CM | POA: Diagnosis not present

## 2022-11-05 DIAGNOSIS — E162 Hypoglycemia, unspecified: Secondary | ICD-10-CM | POA: Diagnosis not present

## 2022-11-05 DIAGNOSIS — K5900 Constipation, unspecified: Secondary | ICD-10-CM | POA: Diagnosis not present

## 2022-11-05 DIAGNOSIS — N183 Chronic kidney disease, stage 3 unspecified: Secondary | ICD-10-CM | POA: Diagnosis not present

## 2022-11-05 DIAGNOSIS — W19XXXD Unspecified fall, subsequent encounter: Secondary | ICD-10-CM | POA: Diagnosis not present

## 2022-11-07 ENCOUNTER — Telehealth: Payer: Self-pay | Admitting: *Deleted

## 2022-11-07 DIAGNOSIS — K5900 Constipation, unspecified: Secondary | ICD-10-CM | POA: Diagnosis not present

## 2022-11-07 NOTE — Patient Outreach (Signed)
  Care Coordination   Follow Up Visit Note   11/07/2022 Name: Jesse Rivera MRN: [id number] DOB: [birth year]  Jesse Rivera is a 89 y.o. year old male who sees Danella Penton, MD for primary care. I spoke with Jesse Rivera, son of  Jesse Rivera by phone today.  What matters to the patients health and wellness today?  Report patient has been sick, hospitalized and now recovering in rehab    Goals Addressed             This Visit's Progress    Effective management of COPD   Not on track    Care Coordination Interventions: Provided patient with basic written and verbal COPD education on self care/management/and exacerbation prevention Advised patient to track and manage COPD triggers Provided written and verbal instructions on pursed lip breathing and utilized returned demonstration as teach back Provided instruction about proper use of medications used for management of COPD including inhalers Provided education about and advised patient to utilize infection prevention strategies to reduce risk of respiratory infection          SDOH assessments and interventions completed:  No     Care Coordination Interventions:  Yes, provided   Interventions Today    Flowsheet Row Most Recent Value  Chronic Disease   Chronic disease during today's visit Chronic Obstructive Pulmonary Disease (COPD)  General Interventions   General Interventions Discussed/Reviewed General Interventions Reviewed  [patient admitted to hospital, now in rehab]       Follow up plan: Follow up call scheduled for 7/11    Encounter Outcome:  Pt. Visit Completed   Kemper Durie, RN, MSN, [facility] Care Management Care Management Coordinator [phone]

## 2022-11-11 DIAGNOSIS — K5900 Constipation, unspecified: Secondary | ICD-10-CM | POA: Diagnosis not present

## 2022-11-11 DIAGNOSIS — R635 Abnormal weight gain: Secondary | ICD-10-CM | POA: Diagnosis not present

## 2022-11-14 ENCOUNTER — Telehealth: Payer: Self-pay | Admitting: *Deleted

## 2022-11-14 NOTE — Patient Outreach (Signed)
  Care Coordination   11/14/2022 Name: Jesse Rivera MRN: [id number] DOB: [birth year]   Voice message received from patient's son, Jesse Rivera.  Call placed back, no answer, voice message left.   Follow Up Plan:  Additional outreach attempts will be made to offer the patient care coordination information and services.   Encounter Outcome:  No Answer   Care Coordination Interventions:  No, not indicated    Kemper Durie, RN, MSN, [facility] Care Management Care Management Coordinator [phone]

## 2022-11-18 DIAGNOSIS — J449 Chronic obstructive pulmonary disease, unspecified: Secondary | ICD-10-CM | POA: Diagnosis not present

## 2022-11-18 DIAGNOSIS — I2510 Atherosclerotic heart disease of native coronary artery without angina pectoris: Secondary | ICD-10-CM | POA: Diagnosis not present

## 2022-11-18 DIAGNOSIS — J9611 Chronic respiratory failure with hypoxia: Secondary | ICD-10-CM | POA: Diagnosis not present

## 2022-11-18 DIAGNOSIS — K5900 Constipation, unspecified: Secondary | ICD-10-CM | POA: Diagnosis not present

## 2022-11-18 DIAGNOSIS — E11649 Type 2 diabetes mellitus with hypoglycemia without coma: Secondary | ICD-10-CM | POA: Diagnosis not present

## 2022-11-18 DIAGNOSIS — I739 Peripheral vascular disease, unspecified: Secondary | ICD-10-CM | POA: Diagnosis not present

## 2022-11-18 DIAGNOSIS — M6281 Muscle weakness (generalized): Secondary | ICD-10-CM | POA: Diagnosis not present

## 2022-11-18 DIAGNOSIS — I482 Chronic atrial fibrillation, unspecified: Secondary | ICD-10-CM | POA: Diagnosis not present

## 2022-11-18 DIAGNOSIS — R1310 Dysphagia, unspecified: Secondary | ICD-10-CM | POA: Diagnosis not present

## 2022-11-20 ENCOUNTER — Ambulatory Visit: Payer: Self-pay | Admitting: *Deleted

## 2022-11-20 NOTE — Patient Outreach (Signed)
  Care Coordination   11/20/2022 Name: Jesse Rivera MRN: [id number] DOB: 12-15-1933   Care Coordination Outreach Attempts:  An unsuccessful telephone outreach was attempted for a scheduled appointment today.  Follow Up Plan:  Additional outreach attempts will be made to offer the patient care coordination information and services.   Encounter Outcome:  No Answer   Care Coordination Interventions:  No, not indicated    Kemper Durie, RN, MSN, [facility] Care Management Care Management Coordinator [phone]

## 2022-12-01 DIAGNOSIS — E119 Type 2 diabetes mellitus without complications: Secondary | ICD-10-CM | POA: Diagnosis not present

## 2022-12-01 DIAGNOSIS — I739 Peripheral vascular disease, unspecified: Secondary | ICD-10-CM | POA: Diagnosis not present

## 2022-12-01 DIAGNOSIS — R627 Adult failure to thrive: Secondary | ICD-10-CM | POA: Diagnosis not present

## 2022-12-01 DIAGNOSIS — I499 Cardiac arrhythmia, unspecified: Secondary | ICD-10-CM | POA: Diagnosis not present

## 2022-12-01 DIAGNOSIS — R008 Other abnormalities of heart beat: Secondary | ICD-10-CM | POA: Diagnosis not present

## 2022-12-01 DIAGNOSIS — N179 Acute kidney failure, unspecified: Secondary | ICD-10-CM | POA: Diagnosis not present

## 2022-12-01 DIAGNOSIS — I130 Hypertensive heart and chronic kidney disease with heart failure and stage 1 through stage 4 chronic kidney disease, or unspecified chronic kidney disease: Secondary | ICD-10-CM | POA: Diagnosis not present

## 2022-12-01 DIAGNOSIS — L89126 Pressure-induced deep tissue damage of left upper back: Secondary | ICD-10-CM | POA: Diagnosis not present

## 2022-12-01 DIAGNOSIS — I4891 Unspecified atrial fibrillation: Secondary | ICD-10-CM | POA: Diagnosis not present

## 2022-12-01 DIAGNOSIS — J441 Chronic obstructive pulmonary disease with (acute) exacerbation: Secondary | ICD-10-CM | POA: Diagnosis not present

## 2022-12-01 DIAGNOSIS — Z950 Presence of cardiac pacemaker: Secondary | ICD-10-CM | POA: Diagnosis not present

## 2022-12-01 DIAGNOSIS — R059 Cough, unspecified: Secondary | ICD-10-CM | POA: Diagnosis not present

## 2022-12-01 DIAGNOSIS — R918 Other nonspecific abnormal finding of lung field: Secondary | ICD-10-CM | POA: Diagnosis not present

## 2022-12-01 DIAGNOSIS — J690 Pneumonitis due to inhalation of food and vomit: Secondary | ICD-10-CM | POA: Diagnosis not present

## 2022-12-01 DIAGNOSIS — I700 Atherosclerosis of aorta: Secondary | ICD-10-CM | POA: Diagnosis not present

## 2022-12-01 DIAGNOSIS — I672 Cerebral atherosclerosis: Secondary | ICD-10-CM | POA: Diagnosis not present

## 2022-12-01 DIAGNOSIS — R64 Cachexia: Secondary | ICD-10-CM | POA: Diagnosis not present

## 2022-12-01 DIAGNOSIS — R1312 Dysphagia, oropharyngeal phase: Secondary | ICD-10-CM | POA: Diagnosis not present

## 2022-12-01 DIAGNOSIS — J432 Centrilobular emphysema: Secondary | ICD-10-CM | POA: Diagnosis not present

## 2022-12-01 DIAGNOSIS — Z794 Long term (current) use of insulin: Secondary | ICD-10-CM | POA: Diagnosis not present

## 2022-12-01 DIAGNOSIS — Z515 Encounter for palliative care: Secondary | ICD-10-CM | POA: Diagnosis not present

## 2022-12-01 DIAGNOSIS — J440 Chronic obstructive pulmonary disease with acute lower respiratory infection: Secondary | ICD-10-CM | POA: Diagnosis not present

## 2022-12-01 DIAGNOSIS — I493 Ventricular premature depolarization: Secondary | ICD-10-CM | POA: Diagnosis not present

## 2022-12-01 DIAGNOSIS — R0602 Shortness of breath: Secondary | ICD-10-CM | POA: Diagnosis not present

## 2022-12-01 DIAGNOSIS — Z4682 Encounter for fitting and adjustment of non-vascular catheter: Secondary | ICD-10-CM | POA: Diagnosis not present

## 2022-12-01 DIAGNOSIS — R0902 Hypoxemia: Secondary | ICD-10-CM | POA: Diagnosis not present

## 2022-12-01 DIAGNOSIS — I4581 Long QT syndrome: Secondary | ICD-10-CM | POA: Diagnosis not present

## 2022-12-01 DIAGNOSIS — I498 Other specified cardiac arrhythmias: Secondary | ICD-10-CM | POA: Diagnosis not present

## 2022-12-01 DIAGNOSIS — Z9981 Dependence on supplemental oxygen: Secondary | ICD-10-CM | POA: Diagnosis not present

## 2022-12-01 DIAGNOSIS — A419 Sepsis, unspecified organism: Secondary | ICD-10-CM | POA: Diagnosis not present

## 2022-12-01 DIAGNOSIS — J90 Pleural effusion, not elsewhere classified: Secondary | ICD-10-CM | POA: Diagnosis not present

## 2022-12-01 DIAGNOSIS — E870 Hyperosmolality and hypernatremia: Secondary | ICD-10-CM | POA: Diagnosis not present

## 2022-12-01 DIAGNOSIS — J189 Pneumonia, unspecified organism: Secondary | ICD-10-CM | POA: Diagnosis not present

## 2022-12-01 DIAGNOSIS — R739 Hyperglycemia, unspecified: Secondary | ICD-10-CM | POA: Diagnosis not present

## 2022-12-01 DIAGNOSIS — I10 Essential (primary) hypertension: Secondary | ICD-10-CM | POA: Diagnosis not present

## 2022-12-01 DIAGNOSIS — I482 Chronic atrial fibrillation, unspecified: Secondary | ICD-10-CM | POA: Diagnosis not present

## 2022-12-01 DIAGNOSIS — E875 Hyperkalemia: Secondary | ICD-10-CM | POA: Diagnosis not present

## 2022-12-01 DIAGNOSIS — D649 Anemia, unspecified: Secondary | ICD-10-CM | POA: Diagnosis not present

## 2022-12-01 DIAGNOSIS — I454 Nonspecific intraventricular block: Secondary | ICD-10-CM | POA: Diagnosis not present

## 2022-12-01 DIAGNOSIS — J9611 Chronic respiratory failure with hypoxia: Secondary | ICD-10-CM | POA: Diagnosis not present

## 2022-12-01 DIAGNOSIS — I129 Hypertensive chronic kidney disease with stage 1 through stage 4 chronic kidney disease, or unspecified chronic kidney disease: Secondary | ICD-10-CM | POA: Diagnosis not present

## 2022-12-01 DIAGNOSIS — J984 Other disorders of lung: Secondary | ICD-10-CM | POA: Diagnosis not present

## 2022-12-01 DIAGNOSIS — I491 Atrial premature depolarization: Secondary | ICD-10-CM | POA: Diagnosis not present

## 2022-12-01 DIAGNOSIS — N183 Chronic kidney disease, stage 3 unspecified: Secondary | ICD-10-CM | POA: Diagnosis not present

## 2022-12-01 DIAGNOSIS — K8689 Other specified diseases of pancreas: Secondary | ICD-10-CM | POA: Diagnosis not present

## 2022-12-01 DIAGNOSIS — R1310 Dysphagia, unspecified: Secondary | ICD-10-CM | POA: Diagnosis not present

## 2022-12-01 DIAGNOSIS — G9340 Encephalopathy, unspecified: Secondary | ICD-10-CM | POA: Diagnosis not present

## 2022-12-01 DIAGNOSIS — K219 Gastro-esophageal reflux disease without esophagitis: Secondary | ICD-10-CM | POA: Diagnosis not present

## 2022-12-01 DIAGNOSIS — Y95 Nosocomial condition: Secondary | ICD-10-CM | POA: Diagnosis not present

## 2022-12-01 DIAGNOSIS — J449 Chronic obstructive pulmonary disease, unspecified: Secondary | ICD-10-CM | POA: Diagnosis not present

## 2022-12-01 DIAGNOSIS — I459 Conduction disorder, unspecified: Secondary | ICD-10-CM | POA: Diagnosis not present

## 2022-12-01 DIAGNOSIS — E1122 Type 2 diabetes mellitus with diabetic chronic kidney disease: Secondary | ICD-10-CM | POA: Diagnosis not present

## 2022-12-01 DIAGNOSIS — R000 Tachycardia, unspecified: Secondary | ICD-10-CM | POA: Diagnosis not present

## 2022-12-01 DIAGNOSIS — E43 Unspecified severe protein-calorie malnutrition: Secondary | ICD-10-CM | POA: Diagnosis not present

## 2022-12-01 DIAGNOSIS — R9389 Abnormal findings on diagnostic imaging of other specified body structures: Secondary | ICD-10-CM | POA: Diagnosis not present

## 2022-12-01 DIAGNOSIS — R4182 Altered mental status, unspecified: Secondary | ICD-10-CM | POA: Diagnosis not present

## 2022-12-01 DIAGNOSIS — N281 Cyst of kidney, acquired: Secondary | ICD-10-CM | POA: Diagnosis not present

## 2022-12-02 DIAGNOSIS — I493 Ventricular premature depolarization: Secondary | ICD-10-CM | POA: Diagnosis not present

## 2022-12-02 DIAGNOSIS — K219 Gastro-esophageal reflux disease without esophagitis: Secondary | ICD-10-CM | POA: Diagnosis not present

## 2022-12-02 DIAGNOSIS — I498 Other specified cardiac arrhythmias: Secondary | ICD-10-CM | POA: Diagnosis not present

## 2022-12-02 DIAGNOSIS — J189 Pneumonia, unspecified organism: Secondary | ICD-10-CM | POA: Diagnosis not present

## 2022-12-02 DIAGNOSIS — I4581 Long QT syndrome: Secondary | ICD-10-CM | POA: Diagnosis not present

## 2022-12-02 DIAGNOSIS — R1310 Dysphagia, unspecified: Secondary | ICD-10-CM | POA: Diagnosis not present

## 2022-12-02 DIAGNOSIS — R008 Other abnormalities of heart beat: Secondary | ICD-10-CM | POA: Diagnosis not present

## 2022-12-02 DIAGNOSIS — R059 Cough, unspecified: Secondary | ICD-10-CM | POA: Diagnosis not present

## 2022-12-02 DIAGNOSIS — I459 Conduction disorder, unspecified: Secondary | ICD-10-CM | POA: Diagnosis not present

## 2022-12-03 DIAGNOSIS — J189 Pneumonia, unspecified organism: Secondary | ICD-10-CM | POA: Diagnosis not present

## 2022-12-04 DIAGNOSIS — I454 Nonspecific intraventricular block: Secondary | ICD-10-CM | POA: Diagnosis not present

## 2022-12-04 DIAGNOSIS — N281 Cyst of kidney, acquired: Secondary | ICD-10-CM | POA: Diagnosis not present

## 2022-12-04 DIAGNOSIS — J441 Chronic obstructive pulmonary disease with (acute) exacerbation: Secondary | ICD-10-CM | POA: Diagnosis not present

## 2022-12-04 DIAGNOSIS — R4182 Altered mental status, unspecified: Secondary | ICD-10-CM | POA: Diagnosis not present

## 2022-12-04 DIAGNOSIS — K8689 Other specified diseases of pancreas: Secondary | ICD-10-CM | POA: Diagnosis not present

## 2022-12-04 DIAGNOSIS — I499 Cardiac arrhythmia, unspecified: Secondary | ICD-10-CM | POA: Diagnosis not present

## 2022-12-04 DIAGNOSIS — Z515 Encounter for palliative care: Secondary | ICD-10-CM | POA: Diagnosis not present

## 2022-12-04 DIAGNOSIS — I491 Atrial premature depolarization: Secondary | ICD-10-CM | POA: Diagnosis not present

## 2022-12-04 DIAGNOSIS — R627 Adult failure to thrive: Secondary | ICD-10-CM | POA: Diagnosis not present

## 2022-12-04 DIAGNOSIS — I672 Cerebral atherosclerosis: Secondary | ICD-10-CM | POA: Diagnosis not present

## 2022-12-04 DIAGNOSIS — J189 Pneumonia, unspecified organism: Secondary | ICD-10-CM | POA: Diagnosis not present

## 2022-12-05 DIAGNOSIS — J189 Pneumonia, unspecified organism: Secondary | ICD-10-CM | POA: Diagnosis not present

## 2022-12-06 DIAGNOSIS — J189 Pneumonia, unspecified organism: Secondary | ICD-10-CM | POA: Diagnosis not present

## 2022-12-07 DIAGNOSIS — J189 Pneumonia, unspecified organism: Secondary | ICD-10-CM | POA: Diagnosis not present

## 2022-12-08 DIAGNOSIS — J189 Pneumonia, unspecified organism: Secondary | ICD-10-CM | POA: Diagnosis not present

## 2022-12-08 DIAGNOSIS — J90 Pleural effusion, not elsewhere classified: Secondary | ICD-10-CM | POA: Diagnosis not present

## 2022-12-08 DIAGNOSIS — J441 Chronic obstructive pulmonary disease with (acute) exacerbation: Secondary | ICD-10-CM | POA: Diagnosis not present

## 2022-12-08 DIAGNOSIS — D649 Anemia, unspecified: Secondary | ICD-10-CM | POA: Diagnosis not present

## 2022-12-08 DIAGNOSIS — Z515 Encounter for palliative care: Secondary | ICD-10-CM | POA: Diagnosis not present

## 2022-12-08 DIAGNOSIS — R627 Adult failure to thrive: Secondary | ICD-10-CM | POA: Diagnosis not present

## 2022-12-08 DIAGNOSIS — J984 Other disorders of lung: Secondary | ICD-10-CM | POA: Diagnosis not present

## 2022-12-09 DIAGNOSIS — Z515 Encounter for palliative care: Secondary | ICD-10-CM | POA: Diagnosis not present

## 2022-12-09 DIAGNOSIS — J189 Pneumonia, unspecified organism: Secondary | ICD-10-CM | POA: Diagnosis not present

## 2022-12-09 DIAGNOSIS — J441 Chronic obstructive pulmonary disease with (acute) exacerbation: Secondary | ICD-10-CM | POA: Diagnosis not present

## 2022-12-09 DIAGNOSIS — R627 Adult failure to thrive: Secondary | ICD-10-CM | POA: Diagnosis not present

## 2022-12-10 DIAGNOSIS — J189 Pneumonia, unspecified organism: Secondary | ICD-10-CM | POA: Diagnosis not present

## 2022-12-10 DIAGNOSIS — J441 Chronic obstructive pulmonary disease with (acute) exacerbation: Secondary | ICD-10-CM | POA: Diagnosis not present

## 2022-12-10 DIAGNOSIS — Z515 Encounter for palliative care: Secondary | ICD-10-CM | POA: Diagnosis not present

## 2022-12-10 DIAGNOSIS — R627 Adult failure to thrive: Secondary | ICD-10-CM | POA: Diagnosis not present

## 2022-12-11 DIAGNOSIS — J449 Chronic obstructive pulmonary disease, unspecified: Secondary | ICD-10-CM | POA: Diagnosis not present

## 2022-12-11 DIAGNOSIS — N183 Chronic kidney disease, stage 3 unspecified: Secondary | ICD-10-CM | POA: Diagnosis not present

## 2022-12-12 ENCOUNTER — Telehealth: Payer: Self-pay | Admitting: *Deleted

## 2022-12-12 DIAGNOSIS — R918 Other nonspecific abnormal finding of lung field: Secondary | ICD-10-CM | POA: Diagnosis not present

## 2022-12-12 DIAGNOSIS — R1310 Dysphagia, unspecified: Secondary | ICD-10-CM | POA: Diagnosis not present

## 2022-12-12 NOTE — Patient Outreach (Signed)
  Care Coordination   12/12/2022 Name: Jesse Rivera MRN: [id number] DOB: 1934/02/08   Care Coordination Outreach Attempts:  An unsuccessful telephone outreach was attempted today to offer the patient information about available care coordination services.  Call was placed to son [name], unsuccessful, message left.  Noted that patient is current admitted to [facility] as of 7/22.   Follow Up Plan:  Additional outreach attempts will be made to offer the patient care coordination information and services.   Encounter Outcome:  No Answer   Care Coordination Interventions:  No, not indicated    Kemper Durie, RN, MSN, [facility] Care Management Care Management Coordinator [phone]

## 2022-12-12 NOTE — Patient Outreach (Signed)
  Care Coordination   Follow Up Visit Note   12/12/2022 Name: Jesse Rivera MRN: [id number] DOB: [birth year]  Jesse Rivera is a 89 y.o. year old male who sees Danella Penton, MD for primary care. I spoke with  Jesse Rivera, son of Jesse Rivera by phone today.  What matters to the patients health and wellness today? Son confirms patient currently admitted to hospital.  Unsure what the discharge disposition will be but inquires about in home aide assistance.  Advised of process, state they are waiting for Medicaid to be approved.  He will call this care manager back once patient is discharged home and they are ready to proceed with PCS.   SDOH assessments and interventions completed:  No     Care Coordination Interventions:  No, not indicated   Follow up plan:  Son will call once patient discharged back home    Encounter Outcome:  Pt. Visit Completed   Kemper Durie, RN, MSN, [facility] Care Management Care Management Coordinator [phone]

## 2022-12-15 DIAGNOSIS — R0902 Hypoxemia: Secondary | ICD-10-CM | POA: Diagnosis not present

## 2022-12-15 DIAGNOSIS — Z950 Presence of cardiac pacemaker: Secondary | ICD-10-CM | POA: Diagnosis not present

## 2022-12-15 DIAGNOSIS — R918 Other nonspecific abnormal finding of lung field: Secondary | ICD-10-CM | POA: Diagnosis not present

## 2022-12-15 DIAGNOSIS — Z4682 Encounter for fitting and adjustment of non-vascular catheter: Secondary | ICD-10-CM | POA: Diagnosis not present

## 2022-12-16 DIAGNOSIS — I454 Nonspecific intraventricular block: Secondary | ICD-10-CM | POA: Diagnosis not present

## 2022-12-16 DIAGNOSIS — I499 Cardiac arrhythmia, unspecified: Secondary | ICD-10-CM | POA: Diagnosis not present

## 2022-12-18 DIAGNOSIS — R9389 Abnormal findings on diagnostic imaging of other specified body structures: Secondary | ICD-10-CM | POA: Diagnosis not present

## 2022-12-18 DIAGNOSIS — J189 Pneumonia, unspecified organism: Secondary | ICD-10-CM | POA: Diagnosis not present

## 2022-12-19 DIAGNOSIS — J189 Pneumonia, unspecified organism: Secondary | ICD-10-CM | POA: Diagnosis not present

## 2022-12-20 DIAGNOSIS — I10 Essential (primary) hypertension: Secondary | ICD-10-CM | POA: Diagnosis not present

## 2022-12-20 DIAGNOSIS — J449 Chronic obstructive pulmonary disease, unspecified: Secondary | ICD-10-CM | POA: Diagnosis not present

## 2022-12-20 DIAGNOSIS — Z794 Long term (current) use of insulin: Secondary | ICD-10-CM | POA: Diagnosis not present

## 2022-12-20 DIAGNOSIS — I739 Peripheral vascular disease, unspecified: Secondary | ICD-10-CM | POA: Diagnosis not present

## 2022-12-20 DIAGNOSIS — I482 Chronic atrial fibrillation, unspecified: Secondary | ICD-10-CM | POA: Diagnosis not present

## 2022-12-20 DIAGNOSIS — E119 Type 2 diabetes mellitus without complications: Secondary | ICD-10-CM | POA: Diagnosis not present

## 2022-12-20 DIAGNOSIS — J9611 Chronic respiratory failure with hypoxia: Secondary | ICD-10-CM | POA: Diagnosis not present

## 2022-12-20 DIAGNOSIS — J189 Pneumonia, unspecified organism: Secondary | ICD-10-CM | POA: Diagnosis not present

## 2022-12-23 ENCOUNTER — Telehealth: Payer: Self-pay | Admitting: *Deleted

## 2022-12-23 NOTE — Patient Outreach (Signed)
  Care Coordination   Follow Up Visit Note   12-27-2022 Name: Davidlee Beare MRN: [id number] DOB: 1933/10/01  Mykel Zapien is a 89 y.o. year old male who sees Danella Penton, MD for primary care.   What matters to the patients health and wellness today?  Noted upon chart review that patient passes away on 2022/12/24 while still in hospital.      Goals Addressed             This Visit's Progress    COMPLETED: Effective management of COPD   Not on track    Care Coordination Interventions: Provided patient with basic written and verbal COPD education on self care/management/and exacerbation prevention Advised patient to track and manage COPD triggers Provided written and verbal instructions on pursed lip breathing and utilized returned demonstration as teach back Provided instruction about proper use of medications used for management of COPD including inhalers Provided education about and advised patient to utilize infection prevention strategies to reduce risk of respiratory infection  12/27/2022 - Patient passes on 12/24/2022        SDOH assessments and interventions completed:  No     Care Coordination Interventions:  No, not indicated   Follow up plan: No further intervention required.   Encounter Outcome:  Pt. Visit Completed   Kemper Durie, RN, MSN, [facility] Care Management Care Management Coordinator [phone]

## 2023-01-11 DEATH — deceased
